# Patient Record
Sex: Female | Born: 1950 | Race: White | Hispanic: No | Marital: Married | Smoking: Former smoker
Health system: Southern US, Community
[De-identification: ages and names within clinical notes are randomized; demographics above are authoritative.]

## PROBLEM LIST (undated history)

## (undated) DIAGNOSIS — I10 Essential (primary) hypertension: Secondary | ICD-10-CM

## (undated) DIAGNOSIS — R0600 Dyspnea, unspecified: Secondary | ICD-10-CM

## (undated) DIAGNOSIS — Q211 Atrial septal defect, unspecified: Secondary | ICD-10-CM

## (undated) DIAGNOSIS — M419 Scoliosis, unspecified: Secondary | ICD-10-CM

## (undated) DIAGNOSIS — J449 Chronic obstructive pulmonary disease, unspecified: Secondary | ICD-10-CM

## (undated) DIAGNOSIS — E785 Hyperlipidemia, unspecified: Secondary | ICD-10-CM

## (undated) HISTORY — DX: Scoliosis, unspecified: M41.9

## (undated) HISTORY — DX: Essential (primary) hypertension: I10

## (undated) HISTORY — PX: TONSILLECTOMY: SUR1361

## (undated) HISTORY — DX: Chronic obstructive pulmonary disease, unspecified: J44.9

## (undated) HISTORY — DX: Atrial septal defect, unspecified: Q21.10

## (undated) HISTORY — DX: Dyspnea, unspecified: R06.00

## (undated) HISTORY — DX: Hyperlipidemia, unspecified: E78.5

## (undated) HISTORY — PX: CARDIAC SURGERY: SHX584

## (undated) HISTORY — DX: Atrial septal defect: Q21.1

---

## 1999-07-18 ENCOUNTER — Other Ambulatory Visit: Admission: RE | Admit: 1999-07-18 | Discharge: 1999-07-18 | Payer: Self-pay | Admitting: *Deleted

## 2001-03-24 ENCOUNTER — Other Ambulatory Visit: Admission: RE | Admit: 2001-03-24 | Discharge: 2001-03-24 | Payer: Self-pay | Admitting: Obstetrics and Gynecology

## 2001-12-07 ENCOUNTER — Ambulatory Visit (HOSPITAL_COMMUNITY): Admission: RE | Admit: 2001-12-07 | Discharge: 2001-12-07 | Payer: Self-pay | Admitting: Family Medicine

## 2001-12-07 ENCOUNTER — Encounter: Payer: Self-pay | Admitting: Family Medicine

## 2002-12-09 ENCOUNTER — Encounter: Payer: Self-pay | Admitting: Family Medicine

## 2002-12-09 ENCOUNTER — Ambulatory Visit (HOSPITAL_COMMUNITY): Admission: RE | Admit: 2002-12-09 | Discharge: 2002-12-09 | Payer: Self-pay | Admitting: Family Medicine

## 2003-09-08 ENCOUNTER — Encounter: Payer: Self-pay | Admitting: Family Medicine

## 2003-09-08 ENCOUNTER — Ambulatory Visit (HOSPITAL_COMMUNITY): Admission: RE | Admit: 2003-09-08 | Discharge: 2003-09-08 | Payer: Self-pay | Admitting: Family Medicine

## 2003-09-12 ENCOUNTER — Emergency Department (HOSPITAL_COMMUNITY): Admission: EM | Admit: 2003-09-12 | Discharge: 2003-09-12 | Payer: Self-pay | Admitting: Emergency Medicine

## 2003-11-13 ENCOUNTER — Emergency Department (HOSPITAL_COMMUNITY): Admission: EM | Admit: 2003-11-13 | Discharge: 2003-11-13 | Payer: Self-pay | Admitting: Emergency Medicine

## 2003-11-19 ENCOUNTER — Inpatient Hospital Stay (HOSPITAL_COMMUNITY): Admission: EM | Admit: 2003-11-19 | Discharge: 2003-11-23 | Payer: Self-pay | Admitting: *Deleted

## 2004-08-02 ENCOUNTER — Ambulatory Visit (HOSPITAL_COMMUNITY): Admission: RE | Admit: 2004-08-02 | Discharge: 2004-08-02 | Payer: Self-pay | Admitting: Family Medicine

## 2004-08-03 ENCOUNTER — Emergency Department (HOSPITAL_COMMUNITY): Admission: EM | Admit: 2004-08-03 | Discharge: 2004-08-03 | Payer: Self-pay | Admitting: *Deleted

## 2004-09-03 ENCOUNTER — Emergency Department (HOSPITAL_COMMUNITY): Admission: EM | Admit: 2004-09-03 | Discharge: 2004-09-03 | Payer: Self-pay | Admitting: Emergency Medicine

## 2005-03-07 ENCOUNTER — Ambulatory Visit: Payer: Self-pay | Admitting: Cardiology

## 2005-05-15 ENCOUNTER — Emergency Department (HOSPITAL_COMMUNITY): Admission: EM | Admit: 2005-05-15 | Discharge: 2005-05-15 | Payer: Self-pay | Admitting: Emergency Medicine

## 2005-05-19 ENCOUNTER — Emergency Department (HOSPITAL_COMMUNITY): Admission: EM | Admit: 2005-05-19 | Discharge: 2005-05-19 | Payer: Self-pay | Admitting: Family Medicine

## 2005-05-30 ENCOUNTER — Ambulatory Visit (HOSPITAL_COMMUNITY): Admission: RE | Admit: 2005-05-30 | Discharge: 2005-05-30 | Payer: Self-pay | Admitting: Family Medicine

## 2005-07-08 ENCOUNTER — Ambulatory Visit (HOSPITAL_COMMUNITY): Admission: RE | Admit: 2005-07-08 | Discharge: 2005-07-08 | Payer: Self-pay | Admitting: Family Medicine

## 2005-07-11 ENCOUNTER — Ambulatory Visit (HOSPITAL_COMMUNITY): Admission: RE | Admit: 2005-07-11 | Discharge: 2005-07-11 | Payer: Self-pay | Admitting: Family Medicine

## 2005-07-31 ENCOUNTER — Ambulatory Visit (HOSPITAL_COMMUNITY): Admission: RE | Admit: 2005-07-31 | Discharge: 2005-07-31 | Payer: Self-pay | Admitting: Family Medicine

## 2006-03-21 ENCOUNTER — Emergency Department (HOSPITAL_COMMUNITY): Admission: EM | Admit: 2006-03-21 | Discharge: 2006-03-21 | Payer: Self-pay | Admitting: Emergency Medicine

## 2006-03-25 ENCOUNTER — Emergency Department (HOSPITAL_COMMUNITY): Admission: EM | Admit: 2006-03-25 | Discharge: 2006-03-25 | Payer: Self-pay | Admitting: *Deleted

## 2006-03-27 ENCOUNTER — Inpatient Hospital Stay (HOSPITAL_COMMUNITY): Admission: AD | Admit: 2006-03-27 | Discharge: 2006-04-01 | Payer: Self-pay | Admitting: Family Medicine

## 2006-03-27 ENCOUNTER — Ambulatory Visit (HOSPITAL_COMMUNITY): Admission: RE | Admit: 2006-03-27 | Discharge: 2006-03-27 | Payer: Self-pay | Admitting: Family Medicine

## 2006-03-28 ENCOUNTER — Ambulatory Visit: Payer: Self-pay | Admitting: *Deleted

## 2006-04-09 ENCOUNTER — Ambulatory Visit (HOSPITAL_COMMUNITY): Admission: RE | Admit: 2006-04-09 | Discharge: 2006-04-09 | Payer: Self-pay | Admitting: Family Medicine

## 2006-07-14 ENCOUNTER — Ambulatory Visit (HOSPITAL_COMMUNITY): Admission: RE | Admit: 2006-07-14 | Discharge: 2006-07-14 | Payer: Self-pay | Admitting: Family Medicine

## 2006-12-15 ENCOUNTER — Ambulatory Visit (HOSPITAL_COMMUNITY): Admission: RE | Admit: 2006-12-15 | Discharge: 2006-12-15 | Payer: Self-pay | Admitting: Family Medicine

## 2007-01-20 ENCOUNTER — Ambulatory Visit (HOSPITAL_COMMUNITY): Admission: RE | Admit: 2007-01-20 | Discharge: 2007-01-20 | Payer: Self-pay | Admitting: Family Medicine

## 2007-02-24 ENCOUNTER — Ambulatory Visit: Payer: Self-pay | Admitting: Cardiology

## 2007-07-17 ENCOUNTER — Ambulatory Visit (HOSPITAL_COMMUNITY): Admission: RE | Admit: 2007-07-17 | Discharge: 2007-07-17 | Payer: Self-pay | Admitting: Family Medicine

## 2007-10-23 ENCOUNTER — Ambulatory Visit (HOSPITAL_COMMUNITY): Admission: RE | Admit: 2007-10-23 | Discharge: 2007-10-23 | Payer: Self-pay | Admitting: Family Medicine

## 2007-12-25 ENCOUNTER — Other Ambulatory Visit: Admission: RE | Admit: 2007-12-25 | Discharge: 2007-12-25 | Payer: Self-pay | Admitting: Obstetrics and Gynecology

## 2008-02-04 ENCOUNTER — Ambulatory Visit: Payer: Self-pay | Admitting: Cardiology

## 2008-07-19 ENCOUNTER — Ambulatory Visit (HOSPITAL_COMMUNITY): Admission: RE | Admit: 2008-07-19 | Discharge: 2008-07-19 | Payer: Self-pay | Admitting: Family Medicine

## 2009-01-19 ENCOUNTER — Other Ambulatory Visit: Admission: RE | Admit: 2009-01-19 | Discharge: 2009-01-19 | Payer: Self-pay | Admitting: Obstetrics & Gynecology

## 2009-01-31 ENCOUNTER — Ambulatory Visit (HOSPITAL_COMMUNITY): Admission: RE | Admit: 2009-01-31 | Discharge: 2009-01-31 | Payer: Self-pay | Admitting: Family Medicine

## 2009-04-01 DIAGNOSIS — Q2111 Secundum atrial septal defect: Secondary | ICD-10-CM | POA: Insufficient documentation

## 2009-04-01 DIAGNOSIS — M4120 Other idiopathic scoliosis, site unspecified: Secondary | ICD-10-CM | POA: Insufficient documentation

## 2009-04-01 DIAGNOSIS — J449 Chronic obstructive pulmonary disease, unspecified: Secondary | ICD-10-CM

## 2009-04-01 DIAGNOSIS — Q211 Atrial septal defect: Secondary | ICD-10-CM

## 2009-04-01 DIAGNOSIS — I2789 Other specified pulmonary heart diseases: Secondary | ICD-10-CM

## 2009-04-01 DIAGNOSIS — R0609 Other forms of dyspnea: Secondary | ICD-10-CM | POA: Insufficient documentation

## 2009-04-01 DIAGNOSIS — J4489 Other specified chronic obstructive pulmonary disease: Secondary | ICD-10-CM | POA: Insufficient documentation

## 2009-04-06 ENCOUNTER — Ambulatory Visit: Payer: Self-pay | Admitting: Cardiology

## 2009-07-21 ENCOUNTER — Ambulatory Visit (HOSPITAL_COMMUNITY): Admission: RE | Admit: 2009-07-21 | Discharge: 2009-07-21 | Payer: Self-pay | Admitting: Family Medicine

## 2009-10-31 ENCOUNTER — Emergency Department (HOSPITAL_COMMUNITY): Admission: EM | Admit: 2009-10-31 | Discharge: 2009-10-31 | Payer: Self-pay | Admitting: Emergency Medicine

## 2009-11-08 ENCOUNTER — Observation Stay (HOSPITAL_COMMUNITY): Admission: EM | Admit: 2009-11-08 | Discharge: 2009-11-10 | Payer: Self-pay | Admitting: Internal Medicine

## 2009-11-08 ENCOUNTER — Ambulatory Visit: Payer: Self-pay | Admitting: Internal Medicine

## 2009-11-08 ENCOUNTER — Encounter: Payer: Self-pay | Admitting: Internal Medicine

## 2009-11-08 ENCOUNTER — Encounter: Payer: Self-pay | Admitting: Emergency Medicine

## 2009-11-10 ENCOUNTER — Encounter: Payer: Self-pay | Admitting: Cardiology

## 2009-11-10 ENCOUNTER — Ambulatory Visit: Payer: Self-pay | Admitting: Thoracic Surgery (Cardiothoracic Vascular Surgery)

## 2009-11-10 ENCOUNTER — Encounter: Payer: Self-pay | Admitting: Thoracic Surgery (Cardiothoracic Vascular Surgery)

## 2009-11-13 ENCOUNTER — Ambulatory Visit: Payer: Self-pay | Admitting: Cardiology

## 2009-11-13 LAB — CONVERTED CEMR LAB: POC INR: 1.1

## 2009-11-14 ENCOUNTER — Inpatient Hospital Stay (HOSPITAL_BASED_OUTPATIENT_CLINIC_OR_DEPARTMENT_OTHER): Admission: RE | Admit: 2009-11-14 | Discharge: 2009-11-14 | Payer: Self-pay | Admitting: Cardiology

## 2009-11-14 ENCOUNTER — Ambulatory Visit: Payer: Self-pay | Admitting: Cardiology

## 2009-11-22 ENCOUNTER — Telehealth: Payer: Self-pay | Admitting: Cardiology

## 2009-11-22 ENCOUNTER — Ambulatory Visit: Payer: Self-pay | Admitting: Internal Medicine

## 2009-11-28 ENCOUNTER — Ambulatory Visit: Payer: Self-pay | Admitting: Cardiology

## 2009-12-04 ENCOUNTER — Encounter
Admission: RE | Admit: 2009-12-04 | Discharge: 2009-12-04 | Payer: Self-pay | Admitting: Thoracic Surgery (Cardiothoracic Vascular Surgery)

## 2009-12-04 ENCOUNTER — Encounter: Payer: Self-pay | Admitting: Cardiology

## 2009-12-04 ENCOUNTER — Ambulatory Visit: Payer: Self-pay | Admitting: Thoracic Surgery (Cardiothoracic Vascular Surgery)

## 2009-12-08 ENCOUNTER — Emergency Department (HOSPITAL_COMMUNITY): Admission: EM | Admit: 2009-12-08 | Discharge: 2009-12-08 | Payer: Self-pay | Admitting: Emergency Medicine

## 2009-12-12 ENCOUNTER — Ambulatory Visit: Payer: Self-pay | Admitting: Thoracic Surgery (Cardiothoracic Vascular Surgery)

## 2009-12-12 ENCOUNTER — Inpatient Hospital Stay (HOSPITAL_COMMUNITY)
Admission: RE | Admit: 2009-12-12 | Discharge: 2009-12-16 | Payer: Self-pay | Admitting: Thoracic Surgery (Cardiothoracic Vascular Surgery)

## 2009-12-22 ENCOUNTER — Ambulatory Visit: Payer: Self-pay | Admitting: Thoracic Surgery (Cardiothoracic Vascular Surgery)

## 2009-12-22 ENCOUNTER — Encounter
Admission: RE | Admit: 2009-12-22 | Discharge: 2009-12-22 | Payer: Self-pay | Admitting: Thoracic Surgery (Cardiothoracic Vascular Surgery)

## 2009-12-22 ENCOUNTER — Encounter: Payer: Self-pay | Admitting: Cardiology

## 2009-12-29 ENCOUNTER — Telehealth: Payer: Self-pay | Admitting: Cardiology

## 2009-12-29 ENCOUNTER — Ambulatory Visit: Payer: Self-pay | Admitting: Internal Medicine

## 2009-12-29 DIAGNOSIS — R000 Tachycardia, unspecified: Secondary | ICD-10-CM

## 2010-01-01 ENCOUNTER — Telehealth: Payer: Self-pay | Admitting: Cardiology

## 2010-01-04 ENCOUNTER — Ambulatory Visit: Payer: Self-pay | Admitting: Cardiology

## 2010-01-05 ENCOUNTER — Telehealth: Payer: Self-pay | Admitting: Cardiology

## 2010-01-10 ENCOUNTER — Encounter (HOSPITAL_COMMUNITY): Admission: RE | Admit: 2010-01-10 | Discharge: 2010-02-09 | Payer: Self-pay | Admitting: Cardiology

## 2010-01-17 ENCOUNTER — Telehealth: Payer: Self-pay | Admitting: Cardiology

## 2010-01-29 ENCOUNTER — Ambulatory Visit: Payer: Self-pay | Admitting: Thoracic Surgery (Cardiothoracic Vascular Surgery)

## 2010-01-29 ENCOUNTER — Telehealth (INDEPENDENT_AMBULATORY_CARE_PROVIDER_SITE_OTHER): Payer: Self-pay | Admitting: *Deleted

## 2010-01-29 ENCOUNTER — Encounter
Admission: RE | Admit: 2010-01-29 | Discharge: 2010-01-29 | Payer: Self-pay | Admitting: Thoracic Surgery (Cardiothoracic Vascular Surgery)

## 2010-01-29 ENCOUNTER — Encounter: Payer: Self-pay | Admitting: Cardiology

## 2010-02-12 ENCOUNTER — Encounter (HOSPITAL_COMMUNITY): Admission: RE | Admit: 2010-02-12 | Discharge: 2010-03-14 | Payer: Self-pay | Admitting: Cardiology

## 2010-03-01 ENCOUNTER — Telehealth: Payer: Self-pay | Admitting: Cardiology

## 2010-03-15 ENCOUNTER — Encounter (HOSPITAL_COMMUNITY): Admission: RE | Admit: 2010-03-15 | Discharge: 2010-04-14 | Payer: Self-pay | Admitting: Cardiology

## 2010-03-16 ENCOUNTER — Encounter: Payer: Self-pay | Admitting: Cardiology

## 2010-03-28 ENCOUNTER — Encounter: Payer: Self-pay | Admitting: Cardiology

## 2010-04-02 ENCOUNTER — Ambulatory Visit: Payer: Self-pay | Admitting: Cardiology

## 2010-04-16 ENCOUNTER — Encounter (HOSPITAL_COMMUNITY): Admission: RE | Admit: 2010-04-16 | Discharge: 2010-05-16 | Payer: Self-pay | Admitting: Cardiology

## 2010-04-17 IMAGING — CR DG CHEST 2V
2 series · 2 of 2 positions shown · non-contrast
Comparison: CT angio chest 12/04/2009 and 11/08/2009.  Two-view
chest x-ray 01/31/2009.

CLINICAL DATA: Atrial septal defect.  Preoperative respiratory
evaluation.

CHEST - 2 VIEW 12/08/2009:

[view not recorded (1 of 2)]
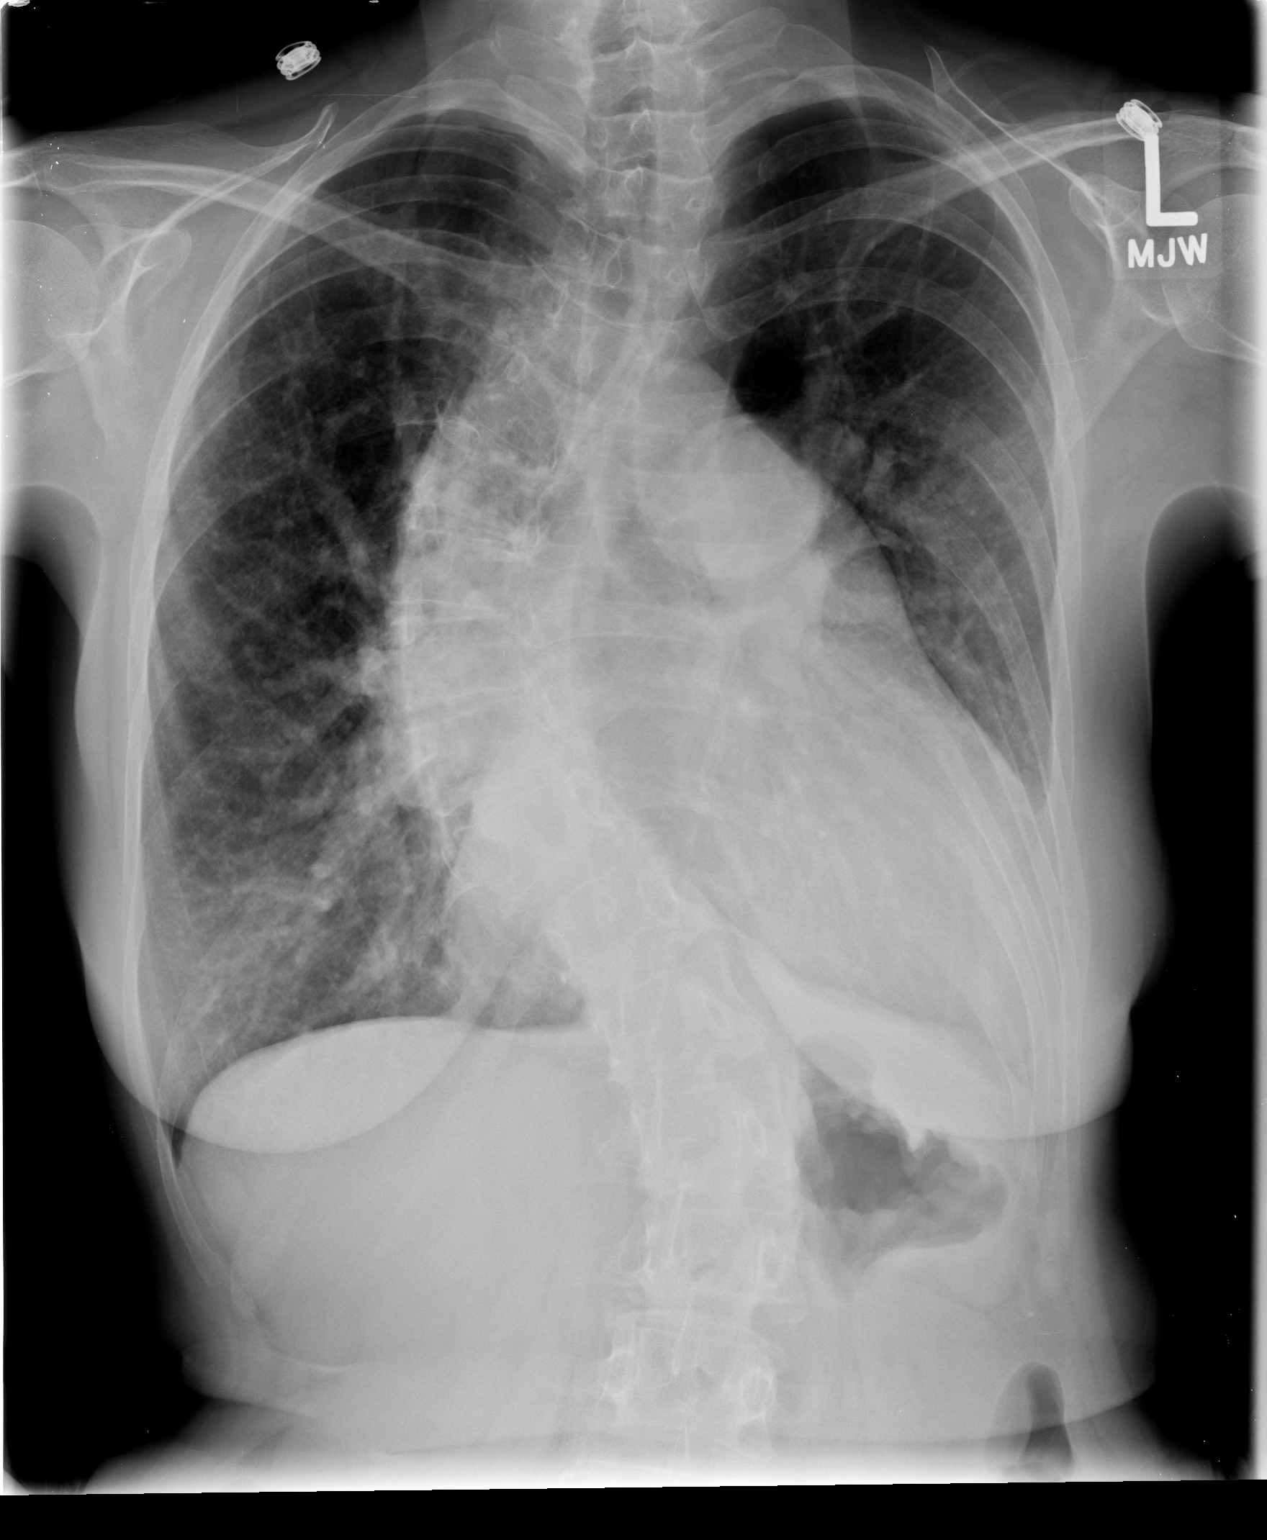

[view not recorded (2 of 2)]
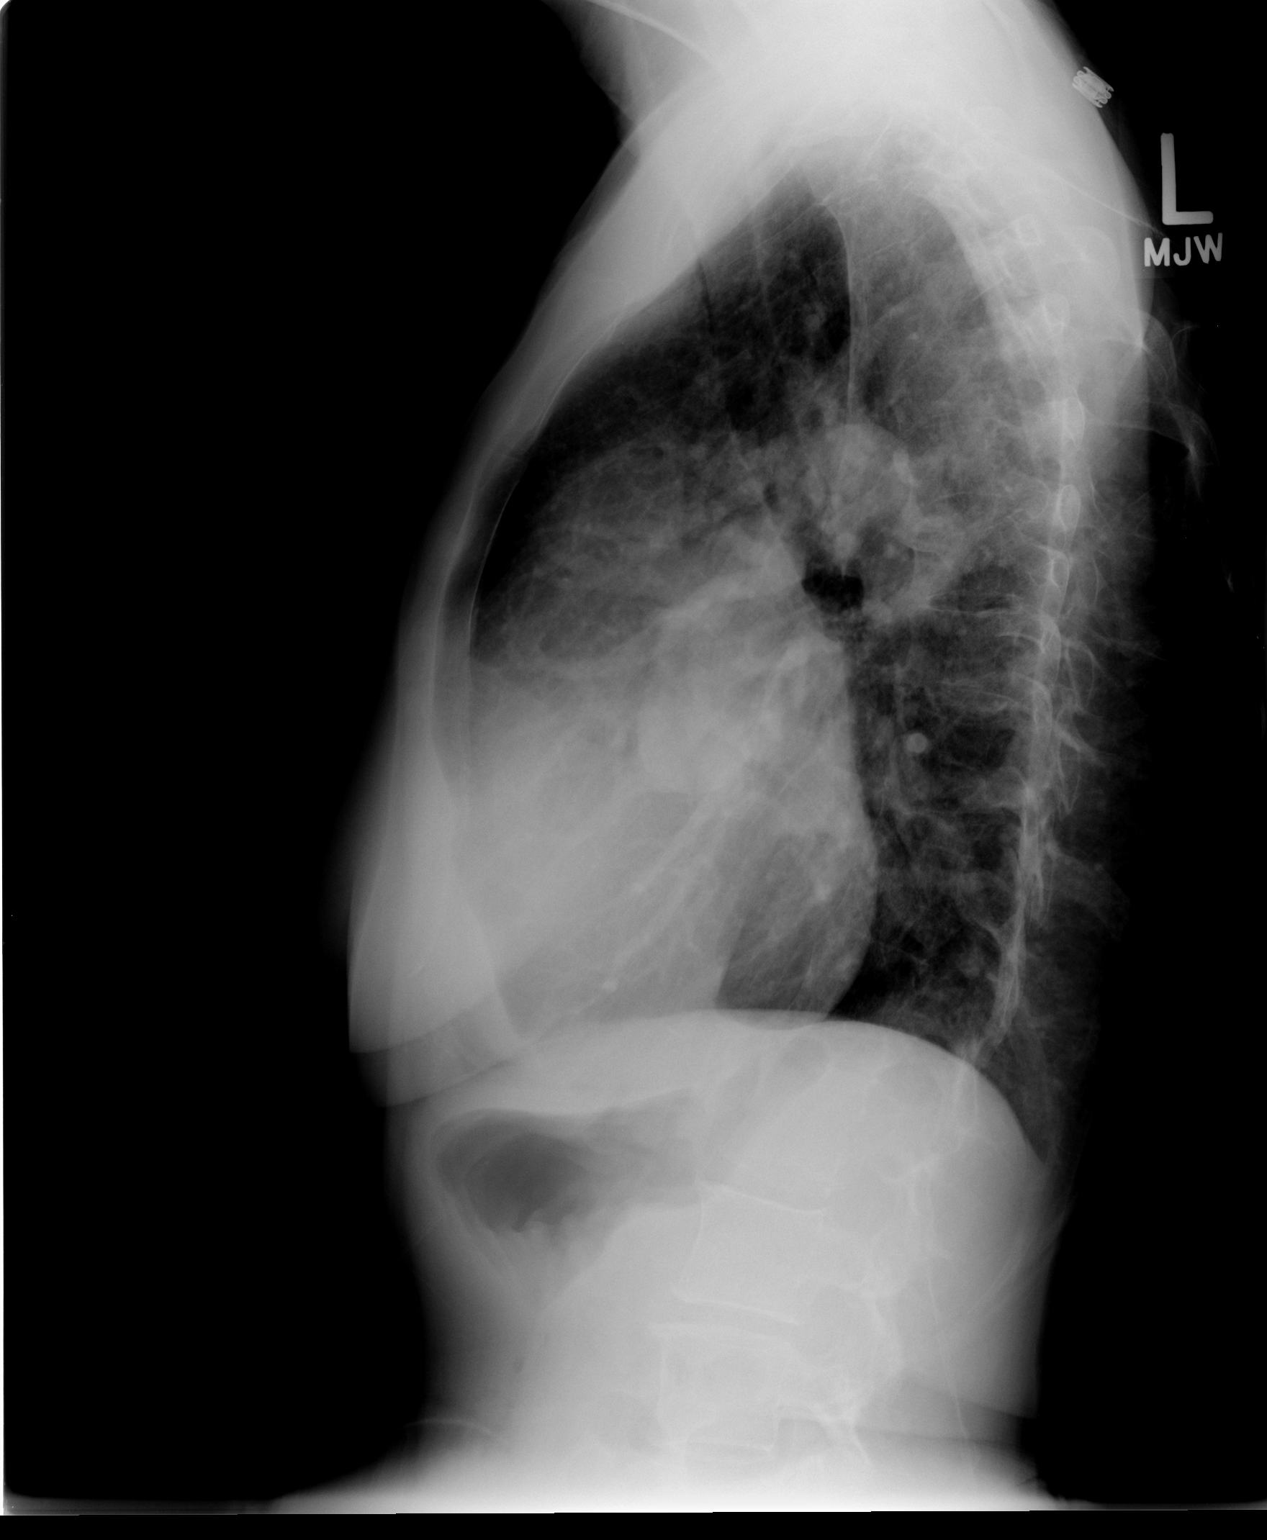

[2 of 2 positions shown; findings below may reference images not displayed]

FINDINGS: Marked cardiomegaly with marked right atrial enlargement
and shunt vascularity, unchanged. Lungs clear.  No pleural
effusions.  Severe thoracic scoliosis convex right, unchanged, with
compensatory thoracolumbar scoliosis convex left.
IMPRESSION: Stable marked cardiomegaly and shunt vascularity.  No acute
cardiopulmonary disease.

## 2010-04-24 IMAGING — CR DG CHEST 1V PORT
1 series · 1 of 1 positions shown · non-contrast
Comparison: 12/15/2009

CLINICAL DATA: Removal of chest tubes.

PORTABLE CHEST - 1 VIEW

[view not recorded]
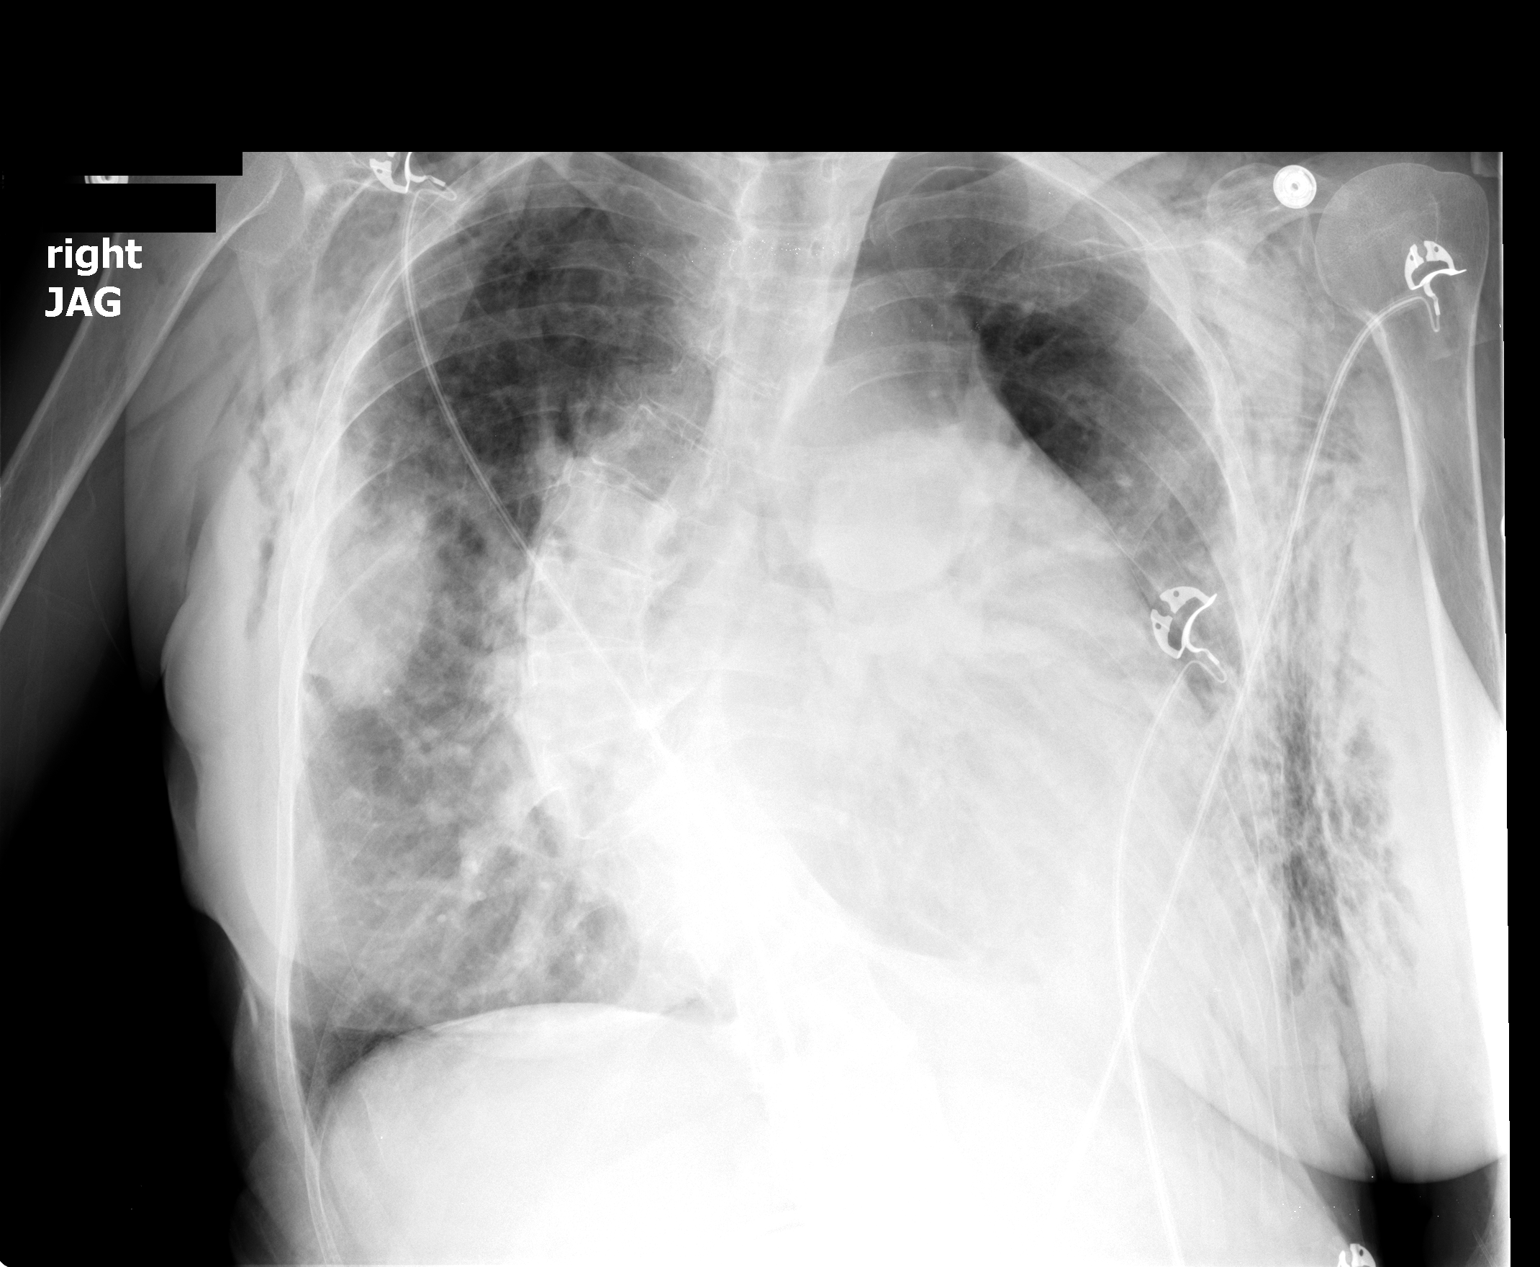

[1 of 1 positions shown; findings below may reference images not displayed]

FINDINGS: The right-sided chest tubes been removed.  There is a
small stable apical pneumothorax.  Stable fluid collection likely
in the right major fissure.  Stable subcutaneous emphysema. Stable
small apical left pneumothorax.
IMPRESSION: 1.  Removal of both right-sided chest tubes with a stable small
right apical pneumothorax.
2.  Stable small left apical pneumothorax.

## 2010-04-25 IMAGING — CR DG CHEST 2V
2 series · 2 of 2 positions shown · non-contrast
Comparison: 12/15/2009 and CT chest 12/04/2009.

CLINICAL DATA: ASD.  Follow-up chest tube removal.

CHEST - 2 VIEW

[w chest pa]
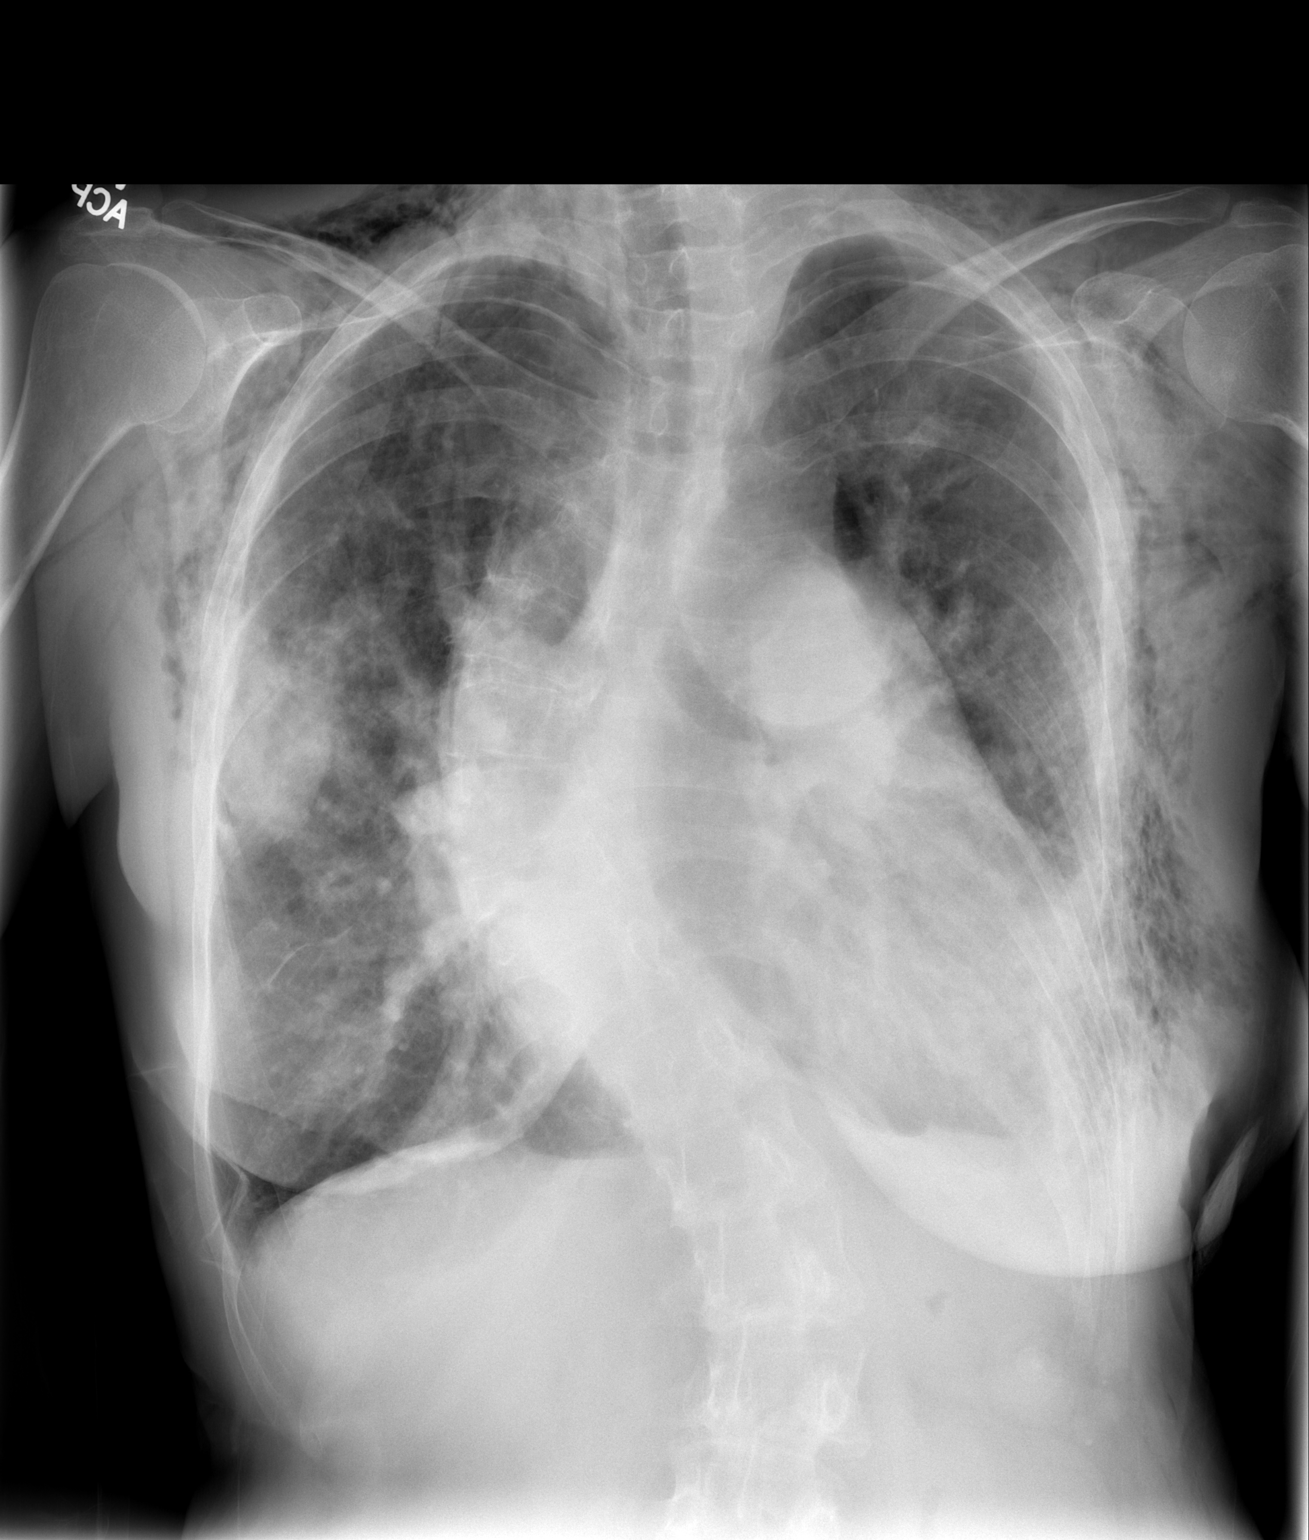

[w chest lat]
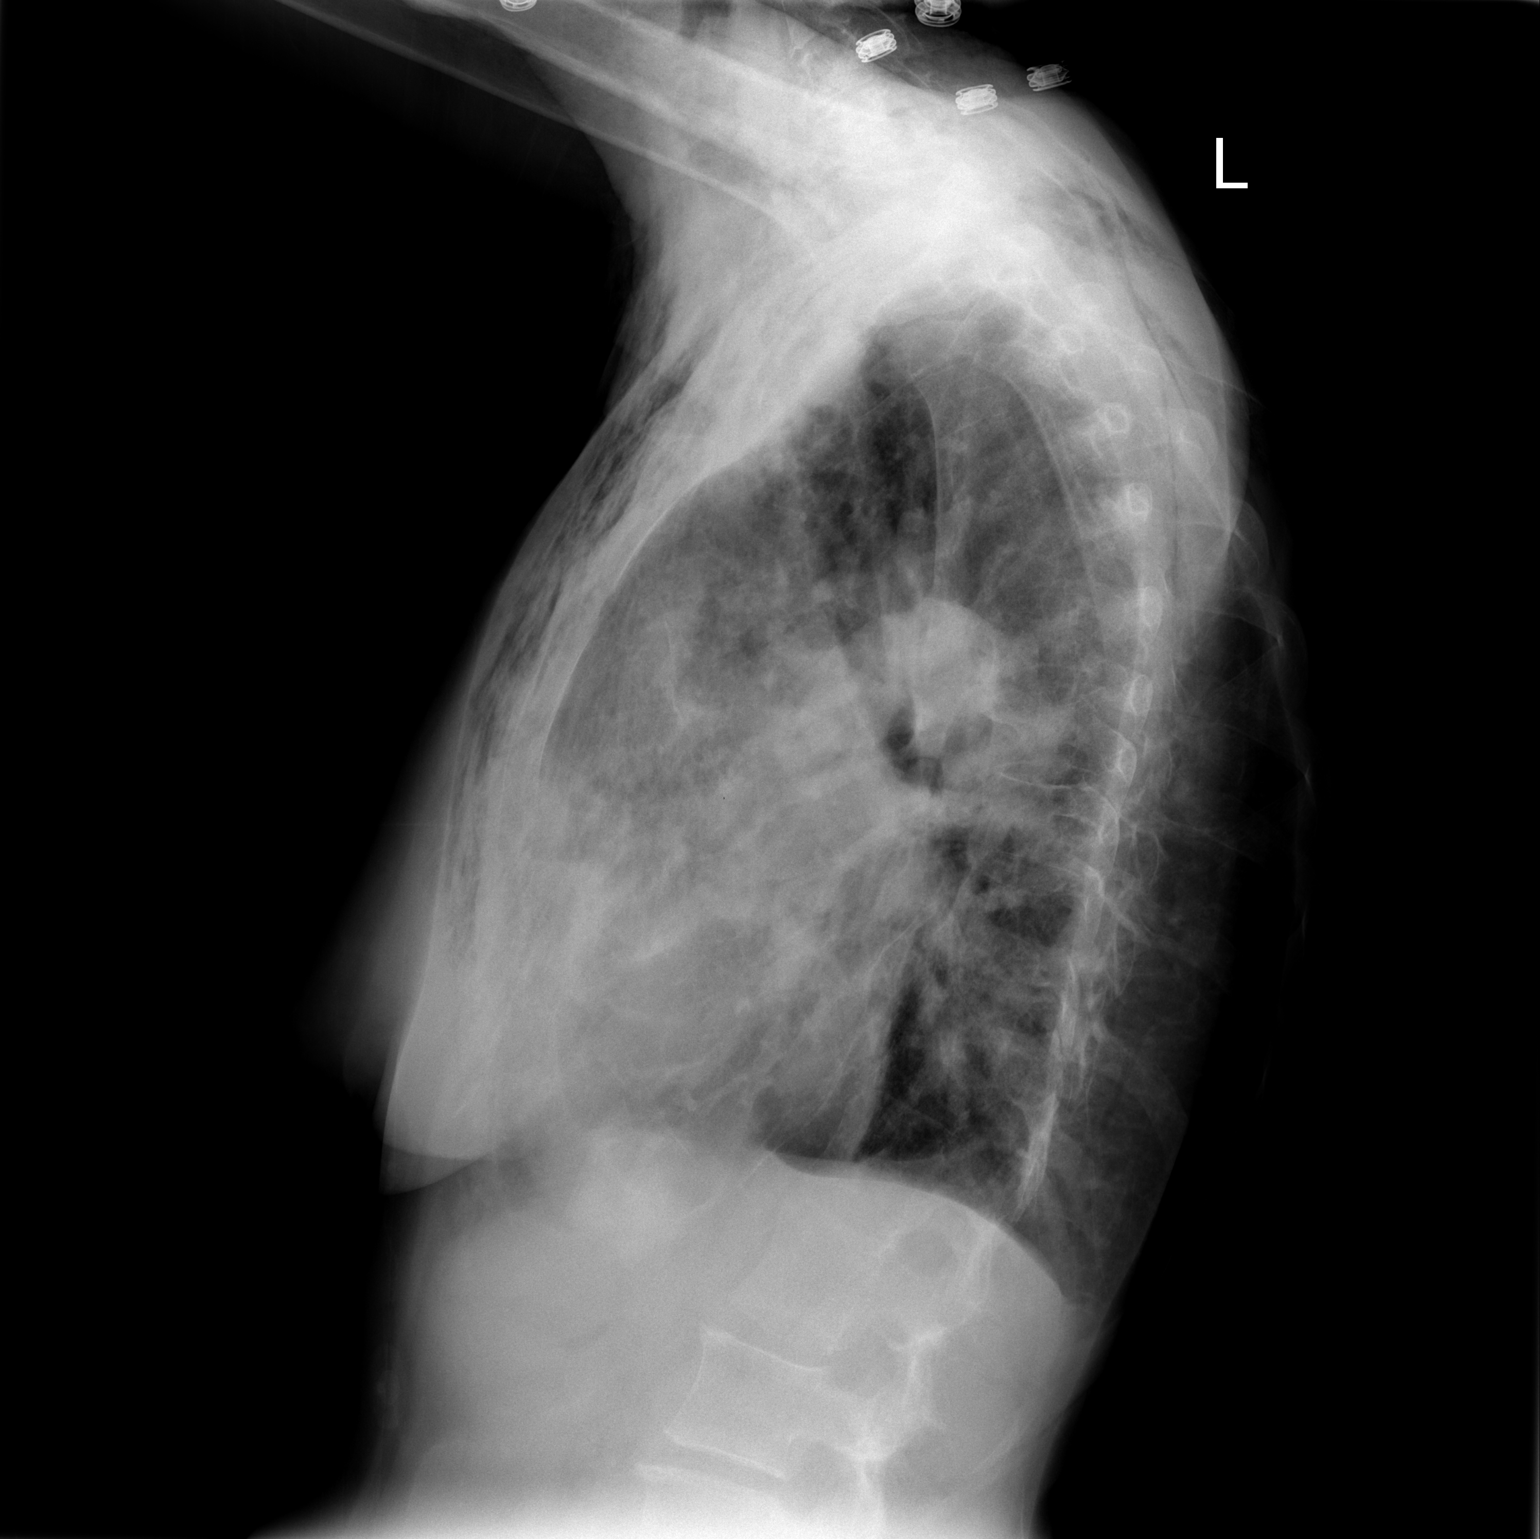

[2 of 2 positions shown; findings below may reference images not displayed]

FINDINGS: Moderate S-shaped scoliosis, convex to the right in the
mid thoracic spine convex left in the upper lumbar spine is noted.
There is cardiomegaly, with prominence of the central pulmonary
arteries and diffuse vascular congestion.  A peripherally based
opacity in the  right mid lung appears stable compared to recent
portable chest radiographs, and on the frontal view appears to be
anteriorly positioned, in the right upper lobe.  This may reflect
airspace disease secondary to infection, or possibly pulmonary
contusion.

There is probable atelectasis and a small amount of pleural fluid
in the left lung base.  Aeration of the left lung base is
improving.  There is diffuse subcutaneous emphysema along the chest
wall bilaterally, left greater than right and along both sides of
the neck.

Small bilateral pneumothoraces are visualized.  These are without
significant interval change compared to the prior day.
IMPRESSION: 1.  Small bilateral pneumothoraces, without significant interval
change.  There is associated extensive subcutaneous emphysema
bilaterally.
2.  Stable appearance of peripheral opacity in the right midlung,
favored to be in the right upper lobe.

## 2010-05-07 ENCOUNTER — Encounter
Admission: RE | Admit: 2010-05-07 | Discharge: 2010-05-07 | Payer: Self-pay | Admitting: Thoracic Surgery (Cardiothoracic Vascular Surgery)

## 2010-05-07 ENCOUNTER — Ambulatory Visit: Payer: Self-pay | Admitting: Thoracic Surgery (Cardiothoracic Vascular Surgery)

## 2010-05-07 ENCOUNTER — Encounter: Payer: Self-pay | Admitting: Cardiology

## 2010-05-09 ENCOUNTER — Encounter: Payer: Self-pay | Admitting: Cardiology

## 2010-05-17 ENCOUNTER — Encounter (HOSPITAL_COMMUNITY): Admission: RE | Admit: 2010-05-17 | Discharge: 2010-06-16 | Payer: Self-pay | Admitting: Cardiology

## 2010-05-23 ENCOUNTER — Encounter: Payer: Self-pay | Admitting: Cardiology

## 2010-07-10 ENCOUNTER — Other Ambulatory Visit: Admission: RE | Admit: 2010-07-10 | Discharge: 2010-07-10 | Payer: Self-pay | Admitting: Obstetrics & Gynecology

## 2010-09-02 ENCOUNTER — Emergency Department (HOSPITAL_COMMUNITY): Admission: EM | Admit: 2010-09-02 | Discharge: 2010-09-02 | Payer: Self-pay | Admitting: Emergency Medicine

## 2010-10-02 ENCOUNTER — Ambulatory Visit: Payer: Self-pay | Admitting: Cardiology

## 2010-10-02 ENCOUNTER — Ambulatory Visit: Payer: Self-pay

## 2010-10-02 ENCOUNTER — Ambulatory Visit (HOSPITAL_COMMUNITY): Admission: RE | Admit: 2010-10-02 | Discharge: 2010-10-02 | Payer: Self-pay | Admitting: Cardiology

## 2010-10-02 ENCOUNTER — Encounter: Payer: Self-pay | Admitting: Cardiology

## 2010-10-02 ENCOUNTER — Ambulatory Visit: Payer: Self-pay | Admitting: Cardiovascular Disease

## 2010-10-04 ENCOUNTER — Telehealth: Payer: Self-pay | Admitting: Cardiology

## 2010-10-05 ENCOUNTER — Telehealth: Payer: Self-pay | Admitting: Cardiology

## 2010-12-30 LAB — CONVERTED CEMR LAB
Calcium: 9.3 mg/dL (ref 8.4–10.5)
Creatinine, Ser: 0.57 mg/dL (ref 0.40–1.20)
Eosinophils Absolute: 0.2 10*3/uL (ref 0.0–0.7)
Eosinophils Relative: 4 % (ref 0–5)
Monocytes Absolute: 0.8 10*3/uL (ref 0.1–1.0)
Monocytes Relative: 13 % — ABNORMAL HIGH (ref 3–12)
Neutro Abs: 4.3 10*3/uL (ref 1.7–7.7)
RBC: 3.51 M/uL — ABNORMAL LOW (ref 3.87–5.11)
RDW: 13.4 % (ref 11.5–15.5)

## 2011-01-03 NOTE — Progress Notes (Signed)
Summary: pt wants amoxicillin for bronchitis  Phone Note Call from Patient Call back at Home Phone [phone]   Caller: Patient Reason for Call: Talk to Nurse, Talk to Doctor Summary of Call: pt has bronchitus and wants MD to call in a Rx for amoxicillin Initial call taken by: Omer Jack,  January 17, 2010 8:53 AM  Follow-up for Phone Call        Spoke with pt. Pt. states has bronchitis and would like for Dr. Daleen Squibb to call a prescription. I let pt. know she needs to see her PCP so he can prescrive an antibiotic. Pt. verbalized understanding. Follow-up by: Ollen Gross, RN, BSN,  January 17, 2010 9:12 AM

## 2011-01-03 NOTE — Progress Notes (Signed)
Summary: pt has elevated HR, night sweats & CP  Phone Note Call from Patient Call back at Home Phone [phone]   Caller: Spouse Reason for Call: Talk to Nurse, Talk to Doctor Summary of Call: pt heart rate is running around 117 and what can she do pt has been having night sweats and chest pain last time she had that was night befor last wednesday the 26th Initial call taken by: [name] [name],  December 29, 2009 11:50 AM  Follow-up for Phone Call        recently D/C'd from hospital 12-16-09 ASD repair---pt noticed heart rate 100-177for the last week--pt denies palpitations-- B/P today--123/77---some soreness in her chest at surgical site--pt saw Dr Barry Dienes last week and was told to continue Lasix because of edema  until she saw Dr Daleen Squibb 01-09-10--pt states her  temperature recently was 98.6-she denies having a fever--will review with Dr Gala Romney and follow-up with pateint [name] [name] pt to come to office for EKG today per Dr Gerome Sam agreed with this plan     New/Updated Medications: LOPRESSOR 50 MG TABS (METOPROLOL TARTRATE) one-half tablet twice a day FUROSEMIDE 40 MG TABS (FUROSEMIDE) one daily * POTASSIUM CHLORIDE not sure of the dose   Current Medications (verified): 1)  Synthroid 50 Mcg Tabs (Levothyroxine Sodium) .... Once Daily 2)  Alprazolam 0.5 Mg Tbdp (Alprazolam) .... Prn 3)  Xopenex Hfa 45 Mcg/act Aero (Levalbuterol Tartrate) .... Prn 4)  Aspirin 81 Mg Tbec (Aspirin) .... Take One Tablet By Mouth Daily 5)  Lopressor 50 Mg Tabs (Metoprolol Tartrate) .... One-Half Tablet Twice A Day 6)  Furosemide 40 Mg Tabs (Furosemide) .... One Daily 7)  Potassium Chloride .... Not Sure of The Dose  Allergies: 1)  ! * Avelox 2)  ! Sulfa 3)  ! Levaquin

## 2011-01-03 NOTE — Assessment & Plan Note (Signed)
Summary: rov./ gd   Visit Type:  rov Primary Provider:  Dr. Renard Matter  CC:  pt c/o right edema for 2 wks but is doing better now..she does c/o headaches and is wondering if Lopressor is causing the headaches.Marland Kitchenotherwise no other complaints today.  History of Present Illness: Mr Chirino returns today for followup after having her ASD repaired. This was accomplished on December 12, 2009. Please see operative report and discharge summary electronic medical record.  Since discharge, she is slowly improving as far as a soreness in her chest. She also soreness her right groin.  She was to come off her Lopressor now. She is convinced is causing headaches.  Her edema has resolved. She has one followup visit with Dr. Cornelius Moras.  Current Medications (verified): 1)  Synthroid 50 Mcg Tabs (Levothyroxine Sodium) .... Once Daily 2)  Alprazolam 0.5 Mg Tbdp (Alprazolam) .... Prn 3)  Aspirin 81 Mg Tbec (Aspirin) .... Take One Tablet By Mouth Daily 4)  Lopressor 50 Mg Tabs (Metoprolol Tartrate) .... One-Half Tablet Twice A Day  Allergies: 1)  ! * Avelox 2)  ! Sulfa 3)  ! 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   Risk Factors: Smoking Status: quit (04/01/2009)  Review of Systems       negative other than history of present illness  Vital Signs:  Patient profile:   60 year old female Height:      62 inches Weight:      99 pounds BMI:     18.17 Pulse rate:   94 / minute Pulse rhythm:   irregular BP sitting:   110 / 70  (left arm) Cuff size:   large  Vitals Entered By: Danielle Rankin, CMA (January 04, 2010 2:55 PM)  Physical Exam  General:  thin,  chronically ill, older than stated a Head:  normocephalic and atraumatic Neck:  Neck supple, no JVD. No masses, thyromegaly or abnormal cervical nodes. Chest Sparkles Mcneely:  bruising, healing scars Lungs:  clear to auscultation percussion. Heart:  normal S1 split S2 no gallop. Slight right ventricular lift   EKG  Procedure date:  01/04/2010  Findings:      normal sinus rhythm, left atrial enlargement, RSR prime in V1 and V2, ST segment and T-wave changes in inferior lateral  Impression & Recommendations:  Problem # 1:  ATRIAL SEPTAL DEFECT, SECUNDUM TYPE (ICD-745.5) Assessment Improved She is status post repair. She is slowing improving. It is encouraging that she has had no significant edema and that what edema she did have postop is resolving. I will see her back in 3 months. We'll perform an echocardiogram in 6 months. I suspect she will always need to be on beta blocker which I do not thinks causing her headaches.  Problem # 2:  TACHYCARDIA (ICD-785.0) Assessment: Improved  Orders: EKG w/ Interpretation (93000)  Patient Instructions: 1)  Your physician recommends that you schedule a follow-up appointment in: 3 MONTHS WITH DR Dove Gresham 2)  Your physician recommends that you continue on your current medications as directed. Please refer to the Current Medication list given to you today.

## 2011-01-03 NOTE — Medication Information (Signed)
Summary: Coumadin Clinic  Anticoagulant Therapy  Managed by: Inactive PCP: Dr. Renard Matter Supervising MD: Dietrich Pates MD,Virgil Slinger Indication 1: N/A Lab Used: [facility] [place] Site: [place]          Comments: Pt not on coumadin.   Was only checked 1 time pre-cardiac cath since it was not done at the lab with pre-op orders.  Allergies: 1)  ! * Avelox 2)  ! Sulfa 3)  ! Levaquin  Anticoagulation Management History:      Negative risk factors for bleeding include an age less than 20 years old.  The bleeding index is 'low risk'.  Negative CHADS2 values include Age > 18 years old.  Anticoagulation responsible provider: Kalena Mander MD,Riordan Walle.    Anticoagulation Management Assessment/Plan:      The target INR is N/A.  Anticoagulation instructions were given to [facility].  Results were reviewed/authorized by Inactive.         Prior Anticoagulation Instructions: Received call from [facility] that pt needs INR for cath tomorrow. INR 1.1 Result faxed to Aurea Graff in [place] lab

## 2011-01-03 NOTE — Assessment & Plan Note (Signed)
Summary: ekg--tachycardia  Nurse Visit   Vital Signs:  Patient profile:   60 year old female Height:      62 inches Weight:      96.25 pounds Pulse rate:   102 / minute Pulse rhythm:   regular BP supine:   108 / 68  (left arm)  Vitals Entered By: Katina Dung, RN, BSN (December 29, 2009 3:23 PM)  Impression & Recommendations:  Problem # 1:  ATRIAL SEPTAL DEFECT, SECUNDUM TYPE (ICD-745.5) Assessment New  pt called in and c/o rapid heart rate for the last week(see phone note 12-29-09)--pt states she feels OK but noticed her heart rate was in the 100-117 range for the past week--no other symptoms--EKG done and reviewed with Dr Janell Quiet Dr Terald Sleeper Lopressor to 25mg  two times a day--(pt has currently been taking Lopressor 12.5mg  two times a day--this was not correct on the med list)--CBC and BMP today--follow-up with Dr Daleen Squibb next week--pt agreed with this plan Katina Dung, RN, BSN  December 29, 2009 3:44 PM     Orders: T-Basic Metabolic Panel [id number]) T-CBC w/Diff [id number]) EKG w/ Interpretation (93000)   Patient Instructions: 1)  Your physician has recommended you make the following change in your medication:  2)  Increase Lopressor to 25mg  twice a day 3)  Your physician recommends that you return have lab work today--BMP/CBC -745.5 785.0  4)  Your physician recommends that you schedule a follow-up appointment in: 1 week with Dr Juanito Doom      Current Medications (verified): 1)  Synthroid 50 Mcg Tabs (Levothyroxine Sodium) .... Once Daily 2)  Alprazolam 0.5 Mg Tbdp (Alprazolam) .... Prn 3)  Xopenex Hfa 45 Mcg/act Aero (Levalbuterol Tartrate) .... Prn 4)  Aspirin 81 Mg Tbec (Aspirin) .... Take One Tablet By Mouth Daily 5)  Furosemide 40 Mg Tabs (Furosemide) .... One Daily 6)  Potassium Chloride .... Not Sure of The Dose 7)  Lopressor 50 Mg Tabs (Metoprolol Tartrate) .... One-Half Tablet Twice A Day  Allergies: 1)  ! * Avelox 2)  ! Sulfa 3)  !  Levaquin  Orders Added: 1)  T-Basic Metabolic Panel [[id number]] 2)  T-CBC w/Diff [[id number]] 3)  EKG w/ Interpretation [93000]

## 2011-01-03 NOTE — Progress Notes (Signed)
Summary: labs results  Phone Note Call from Patient Call back at Home Phone [phone]   Caller: Patient Reason for Call: Lab or Test Results Initial call taken by: Judie Grieve,  January 01, 2010 2:47 PM  Follow-up for Phone Call        Spoke with patient. Pt. would like to have the lab  results. Lab work was  done  last friday 12/29/09. I let pt know MD has not review labs at this time. MD's nurse will call with results as soon as it is done. Okay with pt. Ollen Gross, RN, BSN  January 01, 2010 2:57 PM

## 2011-01-03 NOTE — Progress Notes (Signed)
Summary: pt HR to low  Phone Note Call from Patient Call back at [phone]   Caller: Patient Reason for Call: Talk to Nurse, Talk to Doctor Summary of Call: pt was doing rehab and HR was only 90 during excercise and it should be more and the techs thinks she is over medicated and she wanted to talk to you about it Initial call taken by: Omer Jack,  March 01, 2010 9:46 AM  Follow-up for Phone Call        SPOKE WITH PT CURRENTLY  TAKING LOPRESSOR 25 MG  two times a day . HAS HAD ONE EPISODE OF DIZZINESS AND ALSO C/O FATIGUE RESTING HR IS 71 PER PT. WILL REVIEW WITH DR Vincente Asbridge AND CONTACT PT WITH TX PLAN. VERBALIZE UNDERSTANDING. Follow-up by: Scherrie Bateman, LPN,  March 01, 2010 2:15 PM  Additional Follow-up for Phone Call Additional follow up Details #1::        decrease to 12.5 mg bid Additional Follow-up by: Gaylord Shih, MD, [facility],  March 01, 2010 4:30 PM    Additional Follow-up for Phone Call Additional follow up Details #2::    pt aware to decrease lopressor to 12. 5 mg two times a day  per dr Phallon Haydu Follow-up by: Scherrie Bateman, LPN,  March 01, 2010 4:32 PM

## 2011-01-03 NOTE — Miscellaneous (Signed)
Summary: [facility] [place] 05-21-10  [facility] [place] 05-21-10   Imported By: Faythe Ghee 05/23/2010 10:12:52  _____________________________________________________________________  External Attachment:    Type:   Image     Comment:   External Document

## 2011-01-03 NOTE — Letter (Signed)
Summary: [facility]   [facility]   Imported By: Roderic Ovens 12/29/2009 16:04:52  _____________________________________________________________________  External Attachment:    Type:   Image     Comment:   External Document

## 2011-01-03 NOTE — Letter (Signed)
Summary: [facility]  [facility]   Imported By: Roderic Ovens 12/15/2009 13:59:19  _____________________________________________________________________  External Attachment:    Type:   Image     Comment:   External Document

## 2011-01-03 NOTE — Miscellaneous (Signed)
Summary: [facility]  [facility]   Imported By: Faythe Ghee 05/09/2010 13:51:58  _____________________________________________________________________  External Attachment:    Type:   Image     Comment:   External Document

## 2011-01-03 NOTE — Progress Notes (Signed)
Summary: ECHO RESULTS  Phone Note Call from Patient Call back at Home Phone [phone]   Caller: Patient Reason for Call: Talk to Nurse, Talk to Doctor, Lab or Test Results Summary of Call: ECHO RESULTS Initial call taken by: Roe Coombs,  October 05, 2010 4:48 PM  Follow-up for Phone Call        Echo results and Md's recommendations given to pt. She verbalized understanding. Follow-up by: Ollen Gross, RN, BSN,  October 05, 2010 5:25 PM

## 2011-01-03 NOTE — Letter (Signed)
Summary: [facility]   [facility]   Imported By: Roderic Ovens 02/08/2010 15:15:12  _____________________________________________________________________  External Attachment:    Type:   Image     Comment:   External Document

## 2011-01-03 NOTE — Letter (Signed)
Summary: [facility]  [facility]   Imported By: Roderic Ovens 06/05/2010 09:19:38  _____________________________________________________________________  External Attachment:    Type:   Image     Comment:   External Document

## 2011-01-03 NOTE — Progress Notes (Signed)
Summary: echo results  Phone Note Call from Patient   Caller: Patient [phone] Reason for Call: Talk to Nurse, Lab or Test Results Summary of Call: pt calling for echo results Initial call taken by: Glynda Jaeger,  October 04, 2010 3:23 PM  Follow-up for Phone Call        Pt awaiting echo results. Mylo Red RN     Appended Document: echo results see my comments on echo report.

## 2011-01-03 NOTE — Miscellaneous (Signed)
Summary: cardiac rehab [place]  cardiac rehab [place]   Imported By: Faythe Ghee 03/16/2010 15:11:51  _____________________________________________________________________  External Attachment:    Type:   Image     Comment:   External Document

## 2011-01-03 NOTE — Progress Notes (Signed)
Summary: questions about cardic rehab exerc[facility],  Phone Note Call from Patient Call back at Home Phone [phone] Call back at [phone]   Caller: Patient Summary of Call: Pt have quetions she needs to ask  regarding cardic rehab exercrise. Initial call taken by: Judie Grieve,  January 05, 2010 8:29 AM  Follow-up for Phone Call        PT ASKING IF OKAY TO START REHAB 4 WEEKS OUT FROM SX. SPOKE  WITH DR Stacie Templin  2 PLUS WEEKS OUT FROM PROCEDURE IS OKAY TO START REHAB. Follow-up by: Scherrie Bateman, LPN,  January 05, 2010 9:24 AM  Additional Follow-up for Phone Call Additional follow up Details #1::        PT AWARE.

## 2011-01-03 NOTE — Assessment & Plan Note (Signed)
Summary: 6 MO F/U ./CY   Visit Type:  6 mo f/u Primary Provider:  Dr. Renard Matter  CC:  pt states she feels alot better...offers no cardiac complaints today.  History of Present Illness: [name] [name] returns today for an echocardiogram and followup of her history of atrial septal defect repair in January of this year. Echo is pending.  She denies any significant dyspnea on exertion, syncope or presyncope, palpitations, or lower extremity edema.  Current Medications (verified): 1)  Synthroid 50 Mcg Tabs (Levothyroxine Sodium) .... Once Daily 2)  Alprazolam 0.5 Mg Tbdp (Alprazolam) .... Prn 3)  Aspirin 81 Mg Tbec (Aspirin) .... Take One Tablet By Mouth Daily 4)  Metoprolol Tartrate 25 Mg Tabs (Metoprolol Tartrate) .... 1/2 Tab Two Times A Day 5)  Amoxicillin 500 Mg Caps (Amoxicillin) .... 2000 Mg 1-2 Hours Before Cleaning  Allergies: 1)  ! * Avelox 2)  ! Sulfa 3)  ! 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   Risk Factors: Smoking Status: quit (04/01/2009)  Review of Systems       negative other than history of present illness  Vital Signs:  Patient profile:   60 year old female Height:      62 inches Weight:      108.8 pounds BMI:     19.97 Pulse rate:   72 / minute Pulse rhythm:   regular BP sitting:   108 / 62  (left arm) Cuff size:   large  Vitals Entered By: Danielle Rankin, CMA (October 02, 2010 3:15 PM)  Physical Exam  General:  thin, no acute distress Head:  normocephalic and atraumatic Eyes:  glasses otherwise normal Neck:  Neck supple, no JVD. No masses, thyromegaly or abnormal cervical nodes. Lungs:  Clear bilaterally to  auscultation and percussion. Heart:  PMI nondisplaced, regular rate and rhythm, S2 splits, soft systolic murmur along left sternal border. No obvious right ventricular lift Msk:  Back normal, normal gait. Muscle strength and tone normal. Pulses:  pulses normal in all 4 extremities Extremities:  No clubbing or cyanosis. Neurologic:  Alert and oriented x 3. Skin:  Intact without lesions or rashes. Psych:  Normal affect.   Impression & Recommendations:  Problem # 1:  ATRIAL SEPTAL DEFECT, SECUNDUM TYPE (ICD-745.5) Assessment Improved  status post repair, exam stable, suspect echo will be unremarkable. Followup in a year. We'll call her with echo results.  Problem # 2:  HYPERTENSION, PULMONARY (ICD-416.8) hopefully, this will show improvement. Clinically she appears to be improved.  Problem # 3:  TACHYCARDIA (ICD-785.0) Assessment: Improved  Patient Instructions: 1)  Your physician recommends that you schedule a follow-up appointment in: 12 months--we will call you with results of echo

## 2011-01-03 NOTE — Progress Notes (Signed)
  Faxed 12 lead over to [facility] w/ Dr.Owens Office to fax [phone] [facility]  January 29, 2010 2:23 PM

## 2011-01-03 NOTE — Assessment & Plan Note (Signed)
Summary: PER CHECK OUT/SF   Visit Type:  3 mo f/u Primary Provider:  Dr. Renard Matter  CC:  no cardiac complaints today.  History of Present Illness: Ms Tercero returns today for further evaluation and management of her history of congenital heart disease, status post secundum atrial septal defect repair with a core matrix patch December 12, 2009.  She's been in cardiac rehabilitation and [facility] and clearly her strength and dyspnea have improved. She denies any peripheral edema. She's had no palpitations or syncope. Her resting heart rate is now down to 70s.  She is anxious to get her teeth cleaned. A vascular weight till July 11 her after and to take amoxicillin per [facility] guideline  She denies any fever chills or chest pain  Current Medications (verified): 1)  Synthroid 50 Mcg Tabs (Levothyroxine Sodium) .... Once Daily 2)  Alprazolam 0.5 Mg Tbdp (Alprazolam) .... Prn 3)  Aspirin 81 Mg Tbec (Aspirin) .... Take One Tablet By Mouth Daily 4)  Metoprolol Tartrate 25 Mg Tabs (Metoprolol Tartrate) .... 1/2 Tab Two Times A Day  Allergies: 1)  ! * Avelox 2)  ! Sulfa 3)  ! 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   Risk Factors: Smoking Status: quit (04/01/2009)  Review of Systems        negative other than history of present illness  Vital Signs:  Patient profile:   60 year old female Height:      62 inches Weight:      96 pounds BMI:     17.62 Pulse rate:   76 / minute Pulse rhythm:   regular BP sitting:   112 / 70  (left arm) Cuff size:   large  Vitals Entered By: Danielle Rankin, CMA (Apr 02, 2010 2:01 PM)  Physical  Exam  General:  she looks much healthier with better color since her surgery. No acute distress Head:  normocephalic and atraumatic Eyes:  PERRLA/EOM intact; conjunctiva and lids normal. Mouth:  Teeth, gums and palate normal. Oral mucosa normal. Neck:  Neck supple, no JVD. No masses, thyromegaly or abnormal cervical nodes. Chest Yocelin Vanlue:  no deformities or breast masses noted Lungs:  decreased breath sounds throughout Heart:  regular rate and rhythm, normal S1 and split S2, no obvious murmur, and no right-sided gallop or lift Abdomen:  Bowel sounds positive; abdomen soft and non-tender without masses, organomegaly, or hernias noted. No hepatosplenomegaly. Msk:  Back normal, normal gait. Muscle strength and tone normal. Pulses:  pulses normal in all 4 extremities Extremities:  No clubbing or cyanosis. Neurologic:  Alert and oriented x 3. Skin:  Intact without lesions or rashes. Psych:  Normal affect.   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 Take one tablet by mouth daily    Metoprolol Tartrate 25 Mg Tabs (Metoprolol tartrate) .Marland Kitchen... 1/2 tab two times a day  Problem # 3:  TACHYCARDIA (ICD-785.0) Assessment: Improved  Problem # 4:  COPD (ICD-496) Assessment: Unchanged  Patient Instructions: 1)  Your physician recommends that you schedule a follow-up  appointment in: 6 MONTHS WITH DR Ruthvik Barnaby 2)  Your physician recommends that you continue on your current medications as directed. Please refer to the Current Medication list given to you today. 3)  Your physician has requested that you have an echocardiogram.  Echocardiography is a painless test that uses sound waves to create images of your heart. It provides your doctor with information about the size and  shape of your heart and how well your heart's chambers and valves are working.  This procedure takes approximately one hour. There are no restrictions for this procedure. IN 6 MONTHS  Prescriptions: AMOXICILLIN 500 MG CAPS (AMOXICILLIN) 2000 MG 1-2 HOURS BEFORE CLEANING  #4 x 0   Entered by:   Scherrie Bateman, LPN   Authorized by:   Gaylord Shih, MD, [facility]   Signed by:   Scherrie Bateman, LPN on 25/95/6387   Method used:   Electronically to        [facility] (retail)       [address]       [place], [place]  [address]       Ph: [phone]       Fax: [phone]   RxID:   [id number]

## 2011-02-13 LAB — DIFFERENTIAL
Basophils Absolute: 0 K/uL (ref 0.0–0.1)
Basophils Relative: 0 % (ref 0–1)
Eosinophils Absolute: 0 K/uL (ref 0.0–0.7)
Eosinophils Relative: 1 % (ref 0–5)
Lymphocytes Relative: 9 % — ABNORMAL LOW (ref 12–46)
Lymphs Abs: 0.9 K/uL (ref 0.7–4.0)
Monocytes Absolute: 1 K/uL (ref 0.1–1.0)
Monocytes Relative: 10 % (ref 3–12)
Neutro Abs: 7.6 K/uL (ref 1.7–7.7)
Neutrophils Relative %: 80 % — ABNORMAL HIGH (ref 43–77)

## 2011-02-13 LAB — COMPREHENSIVE METABOLIC PANEL
ALT: 9 U/L (ref 0–35)
AST: 16 U/L (ref 0–37)
Albumin: 3.6 g/dL (ref 3.5–5.2)
Alkaline Phosphatase: 92 U/L (ref 39–117)
CO2: 24 mEq/L (ref 19–32)
Chloride: 103 mEq/L (ref 96–112)
Creatinine, Ser: 0.62 mg/dL (ref 0.4–1.2)
GFR calc Af Amer: >60 mL/min (ref >60)
GFR calc non Af Amer: >60 mL/min (ref >60)
Potassium: 3.9 mEq/L (ref 3.5–5.1)
Sodium: 136 mEq/L (ref 135–145)
Total Bilirubin: 1.3 mg/dL — ABNORMAL HIGH (ref 0.3–1.2)

## 2011-02-13 LAB — URINE MICROSCOPIC-ADD ON

## 2011-02-13 LAB — URINALYSIS, ROUTINE W REFLEX MICROSCOPIC
Glucose, UA: NEGATIVE mg/dL
Nitrite: NEGATIVE
Protein, ur: NEGATIVE mg/dL
Urobilinogen, UA: 0.2 mg/dL (ref 0.0–1.0)

## 2011-02-13 LAB — CBC
HCT: 44 % (ref 36.0–46.0)
Hemoglobin: 15 g/dL (ref 12.0–15.0)
MCH: 31.5 pg (ref 26.0–34.0)
Platelets: 165 10*3/uL (ref 150–400)
RBC: 4.76 MIL/uL (ref 3.87–5.11)
WBC: 9.5 10*3/uL (ref 4.0–10.5)

## 2011-02-13 LAB — LIPASE, BLOOD: Lipase: 24 U/L (ref 11–59)

## 2011-02-17 LAB — POCT I-STAT, CHEM 8
Creatinine, Ser: 0.6 mg/dL (ref 0.4–1.2)
Glucose, Bld: 145 mg/dL — ABNORMAL HIGH (ref 70–99)
HCT: 28 % — ABNORMAL LOW (ref 36.0–46.0)
HCT: 35 % — ABNORMAL LOW (ref 36.0–46.0)
Hemoglobin: 11.9 g/dL — ABNORMAL LOW (ref 12.0–15.0)
Hemoglobin: 9.5 g/dL — ABNORMAL LOW (ref 12.0–15.0)
Potassium: 4.4 mEq/L (ref 3.5–5.1)
Sodium: 141 mEq/L (ref 135–145)
TCO2: 27 mmol/L (ref 0–100)
TCO2: 28 mmol/L (ref 0–100)

## 2011-02-17 LAB — POCT I-STAT 3, ART BLOOD GAS (G3+)
Acid-base deficit: 1 mmol/L (ref 0.0–2.0)
Acid-base deficit: 1 mmol/L (ref 0.0–2.0)
Acid-base deficit: 2 mmol/L (ref 0.0–2.0)
Acid-base deficit: 3 mmol/L — ABNORMAL HIGH (ref 0.0–2.0)
Bicarbonate: 23.5 mEq/L (ref 20.0–24.0)
Bicarbonate: 23.5 mEq/L (ref 20.0–24.0)
Bicarbonate: 24.2 mEq/L — ABNORMAL HIGH (ref 20.0–24.0)
Bicarbonate: 28.8 mEq/L — ABNORMAL HIGH (ref 20.0–24.0)
O2 Saturation: 100 %
O2 Saturation: 99 %
Patient temperature: 36.8
Patient temperature: 97.1
Patient temperature: 97.4
TCO2: 25 mmol/L (ref 0–100)
TCO2: 30 mmol/L (ref 0–100)
pCO2 arterial: 33.1 mmHg — ABNORMAL LOW (ref 35.0–45.0)
pCO2 arterial: 51.4 mmHg — ABNORMAL HIGH (ref 35.0–45.0)
pCO2 arterial: 57.2 mmHg (ref 35.0–45.0)
pCO2 arterial: 58.5 mmHg (ref 35.0–45.0)
pCO2 arterial: 89.4 mmHg (ref 35.0–45.0)
pH, Arterial: 7.119 — CL (ref 7.350–7.400)
pH, Arterial: 7.247 — ABNORMAL LOW (ref 7.350–7.400)
pH, Arterial: 7.281 — ABNORMAL LOW (ref 7.350–7.400)
pH, Arterial: 7.306 — ABNORMAL LOW (ref 7.350–7.400)
pH, Arterial: 7.31 — ABNORMAL LOW (ref 7.350–7.400)
pH, Arterial: 7.388 (ref 7.350–7.400)
pO2, Arterial: 117 mmHg — ABNORMAL HIGH (ref 80.0–100.0)
pO2, Arterial: 295 mmHg — ABNORMAL HIGH (ref 80.0–100.0)

## 2011-02-17 LAB — HEMOGLOBIN AND HEMATOCRIT, BLOOD
HCT: 28.9 % — ABNORMAL LOW (ref 36.0–46.0)
Hemoglobin: 10 g/dL — ABNORMAL LOW (ref 12.0–15.0)

## 2011-02-17 LAB — TYPE AND SCREEN

## 2011-02-17 LAB — BASIC METABOLIC PANEL
BUN: 12 mg/dL (ref 6–23)
BUN: 8 mg/dL (ref 6–23)
CO2: 27 mEq/L (ref 19–32)
CO2: 31 mEq/L (ref 19–32)
Calcium: 8 mg/dL — ABNORMAL LOW (ref 8.4–10.5)
Chloride: 103 mEq/L (ref 96–112)
Chloride: 111 mEq/L (ref 96–112)
Chloride: 97 mEq/L (ref 96–112)
Creatinine, Ser: 0.55 mg/dL (ref 0.4–1.2)
Creatinine, Ser: 0.59 mg/dL (ref 0.4–1.2)
GFR calc Af Amer: >60 mL/min (ref >60)
GFR calc Af Amer: >60 mL/min (ref >60)
Glucose, Bld: 107 mg/dL — ABNORMAL HIGH (ref 70–99)
Glucose, Bld: 152 mg/dL — ABNORMAL HIGH (ref 70–99)
Potassium: 4.3 mEq/L (ref 3.5–5.1)
Sodium: 138 mEq/L (ref 135–145)

## 2011-02-17 LAB — GLUCOSE, CAPILLARY
Glucose-Capillary: 105 mg/dL — ABNORMAL HIGH (ref 70–99)
Glucose-Capillary: 114 mg/dL — ABNORMAL HIGH (ref 70–99)
Glucose-Capillary: 121 mg/dL — ABNORMAL HIGH (ref 70–99)
Glucose-Capillary: 131 mg/dL — ABNORMAL HIGH (ref 70–99)
Glucose-Capillary: 141 mg/dL — ABNORMAL HIGH (ref 70–99)
Glucose-Capillary: 179 mg/dL — ABNORMAL HIGH (ref 70–99)
Glucose-Capillary: 72 mg/dL (ref 70–99)
Glucose-Capillary: 75 mg/dL (ref 70–99)
Glucose-Capillary: 98 mg/dL (ref 70–99)

## 2011-02-17 LAB — POCT I-STAT 4, (NA,K, GLUC, HGB,HCT)
Glucose, Bld: 131 mg/dL — ABNORMAL HIGH (ref 70–99)
Glucose, Bld: 158 mg/dL — ABNORMAL HIGH (ref 70–99)
Glucose, Bld: 48 mg/dL — ABNORMAL LOW (ref 70–99)
Glucose, Bld: 90 mg/dL (ref 70–99)
HCT: 26 % — ABNORMAL LOW (ref 36.0–46.0)
HCT: 29 % — ABNORMAL LOW (ref 36.0–46.0)
HCT: 34 % — ABNORMAL LOW (ref 36.0–46.0)
HCT: 39 % (ref 36.0–46.0)
Hemoglobin: 11.6 g/dL — ABNORMAL LOW (ref 12.0–15.0)
Hemoglobin: 6.8 g/dL — CL (ref 12.0–15.0)
Hemoglobin: 8.8 g/dL — ABNORMAL LOW (ref 12.0–15.0)
Hemoglobin: 9.9 g/dL — ABNORMAL LOW (ref 12.0–15.0)
Potassium: 3.3 mEq/L — ABNORMAL LOW (ref 3.5–5.1)
Potassium: 3.5 mEq/L (ref 3.5–5.1)
Potassium: 3.9 mEq/L (ref 3.5–5.1)
Potassium: 5.6 mEq/L — ABNORMAL HIGH (ref 3.5–5.1)
Sodium: 138 mEq/L (ref 135–145)
Sodium: 139 mEq/L (ref 135–145)
Sodium: 142 mEq/L (ref 135–145)
Sodium: 142 mEq/L (ref 135–145)

## 2011-02-17 LAB — CBC
HCT: 27.5 % — ABNORMAL LOW (ref 36.0–46.0)
HCT: 37.3 % (ref 36.0–46.0)
HCT: 46.8 % — ABNORMAL HIGH (ref 36.0–46.0)
Hemoglobin: 9.4 g/dL — ABNORMAL LOW (ref 12.0–15.0)
Hemoglobin: 9.8 g/dL — ABNORMAL LOW (ref 12.0–15.0)
MCHC: 33.4 g/dL (ref 30.0–36.0)
MCHC: 34 g/dL (ref 30.0–36.0)
MCHC: 34.2 g/dL (ref 30.0–36.0)
MCHC: 34.2 g/dL (ref 30.0–36.0)
MCHC: 34.5 g/dL (ref 30.0–36.0)
MCV: 95 fL (ref 78.0–100.0)
MCV: 95.4 fL (ref 78.0–100.0)
MCV: 95.9 fL (ref 78.0–100.0)
MCV: 97.2 fL (ref 78.0–100.0)
MCV: 97.5 fL (ref 78.0–100.0)
Platelets: 186 10*3/uL (ref 150–400)
Platelets: 70 10*3/uL — ABNORMAL LOW (ref 150–400)
Platelets: 75 10*3/uL — ABNORMAL LOW (ref 150–400)
Platelets: 79 10*3/uL — ABNORMAL LOW (ref 150–400)
RBC: 2.71 MIL/uL — ABNORMAL LOW (ref 3.87–5.11)
RBC: 2.86 MIL/uL — ABNORMAL LOW (ref 3.87–5.11)
RBC: 3 MIL/uL — ABNORMAL LOW (ref 3.87–5.11)
RBC: 3.65 MIL/uL — ABNORMAL LOW (ref 3.87–5.11)
RBC: 3.93 MIL/uL (ref 3.87–5.11)
RDW: 15 % (ref 11.5–15.5)
RDW: 15.1 % (ref 11.5–15.5)
WBC: 5.8 10*3/uL (ref 4.0–10.5)
WBC: 6.6 10*3/uL (ref 4.0–10.5)
WBC: 7.5 10*3/uL (ref 4.0–10.5)
WBC: 8.4 10*3/uL (ref 4.0–10.5)

## 2011-02-17 LAB — BLOOD GAS, ARTERIAL
Acid-Base Excess: 1.7 mmol/L (ref 0.0–2.0)
Bicarbonate: 25.5 meq/L — ABNORMAL HIGH (ref 20.0–24.0)
Drawn by: 206361
FIO2: 0.21 %
O2 Saturation: 96.6 %
Patient temperature: 98.6
TCO2: 26.7 mmol/L (ref 0–100)
pCO2 arterial: 38.4 mmHg (ref 35.0–45.0)
pH, Arterial: 7.438 — ABNORMAL HIGH (ref 7.350–7.400)
pO2, Arterial: 77.1 mmHg — ABNORMAL LOW (ref 80.0–100.0)

## 2011-02-17 LAB — COMPREHENSIVE METABOLIC PANEL WITH GFR
ALT: 15 U/L (ref 0–35)
AST: 28 U/L (ref 0–37)
Albumin: 4.8 g/dL (ref 3.5–5.2)
Alkaline Phosphatase: 86 U/L (ref 39–117)
BUN: 8 mg/dL (ref 6–23)
CO2: 30 meq/L (ref 19–32)
Calcium: 9.7 mg/dL (ref 8.4–10.5)
Chloride: 102 meq/L (ref 96–112)
Creatinine, Ser: 0.77 mg/dL (ref 0.4–1.2)
GFR calc non Af Amer: >60 mL/min
Glucose, Bld: 75 mg/dL (ref 70–99)
Potassium: 4.2 meq/L (ref 3.5–5.1)
Sodium: 141 meq/L (ref 135–145)
Total Bilirubin: 1.4 mg/dL — ABNORMAL HIGH (ref 0.3–1.2)
Total Protein: 7.5 g/dL (ref 6.0–8.3)

## 2011-02-17 LAB — POCT I-STAT 3, VENOUS BLOOD GAS (G3P V)
Acid-base deficit: 1 mmol/L (ref 0.0–2.0)
O2 Saturation: 98 %
Patient temperature: 36.6
pCO2, Ven: 67.7 mmHg — ABNORMAL HIGH (ref 45.0–50.0)

## 2011-02-17 LAB — CREATININE, SERUM
Creatinine, Ser: 0.52 mg/dL (ref 0.4–1.2)
Creatinine, Ser: 0.65 mg/dL (ref 0.4–1.2)
GFR calc Af Amer: >60 mL/min (ref >60)
GFR calc Af Amer: >60 mL/min (ref >60)
GFR calc non Af Amer: >60 mL/min (ref >60)
GFR calc non Af Amer: >60 mL/min (ref >60)

## 2011-02-17 LAB — URINALYSIS, ROUTINE W REFLEX MICROSCOPIC
Glucose, UA: NEGATIVE mg/dL
Hgb urine dipstick: NEGATIVE
Ketones, ur: NEGATIVE mg/dL
Protein, ur: NEGATIVE mg/dL
Urobilinogen, UA: 0.2 mg/dL (ref 0.0–1.0)

## 2011-02-17 LAB — MAGNESIUM
Magnesium: 2.2 mg/dL (ref 1.5–2.5)
Magnesium: 2.8 mg/dL — ABNORMAL HIGH (ref 1.5–2.5)

## 2011-02-17 LAB — HEMOGLOBIN A1C
Hgb A1c MFr Bld: 5.5 % (ref 4.6–6.1)
Mean Plasma Glucose: 111 mg/dL

## 2011-02-17 LAB — ETHANOL: Alcohol, Ethyl (B): <5 mg/dL (ref 0–10)

## 2011-02-17 LAB — PROTIME-INR
INR: 1.12 (ref 0.00–1.49)
Prothrombin Time: 14.3 seconds (ref 11.6–15.2)

## 2011-02-17 LAB — APTT: aPTT: 30 s (ref 24–37)

## 2011-02-17 LAB — RAPID URINE DRUG SCREEN, HOSP PERFORMED
Benzodiazepines: POSITIVE — AB
Cocaine: NOT DETECTED

## 2011-02-17 LAB — ABO/RH: ABO/RH(D): A POS

## 2011-03-05 LAB — DIFFERENTIAL
Basophils Absolute: 0 10*3/uL (ref 0.0–0.1)
Basophils Relative: 0 % (ref 0–1)
Eosinophils Absolute: 0 10*3/uL (ref 0.0–0.7)
Eosinophils Relative: 0 % (ref 0–5)
Neutrophils Relative %: 87 % — ABNORMAL HIGH (ref 43–77)

## 2011-03-05 LAB — POCT I-STAT 3, VENOUS BLOOD GAS (G3P V)
Acid-Base Excess: 1 mmol/L (ref 0.0–2.0)
Acid-Base Excess: 1 mmol/L (ref 0.0–2.0)
Acid-base deficit: 5 mmol/L — ABNORMAL HIGH (ref 0.0–2.0)
Bicarbonate: 22.3 mEq/L (ref 20.0–24.0)
Bicarbonate: 25.6 mEq/L — ABNORMAL HIGH (ref 20.0–24.0)
O2 Saturation: 74 %
O2 Saturation: 75 %
O2 Saturation: 84 %
O2 Saturation: 86 %
O2 Saturation: 86 %
TCO2: 27 mmol/L (ref 0–100)
TCO2: 27 mmol/L (ref 0–100)
TCO2: 28 mmol/L (ref 0–100)
TCO2: 28 mmol/L (ref 0–100)
TCO2: 28 mmol/L (ref 0–100)
pCO2, Ven: 44.3 mmHg — ABNORMAL LOW (ref 45.0–50.0)
pCO2, Ven: 44.3 mmHg — ABNORMAL LOW (ref 45.0–50.0)
pCO2, Ven: 44.9 mmHg — ABNORMAL LOW (ref 45.0–50.0)
pCO2, Ven: 45.4 mmHg (ref 45.0–50.0)
pCO2, Ven: 46.4 mmHg (ref 45.0–50.0)
pCO2, Ven: 50.8 mmHg — ABNORMAL HIGH (ref 45.0–50.0)
pH, Ven: 7.363 — ABNORMAL HIGH (ref 7.250–7.300)
pH, Ven: 7.37 — ABNORMAL HIGH (ref 7.250–7.300)
pH, Ven: 7.372 — ABNORMAL HIGH (ref 7.250–7.300)
pO2, Ven: 28 mmHg — CL (ref 30.0–45.0)
pO2, Ven: 42 mmHg (ref 30.0–45.0)
pO2, Ven: 54 mmHg — ABNORMAL HIGH (ref 30.0–45.0)

## 2011-03-05 LAB — BASIC METABOLIC PANEL
BUN: 12 mg/dL (ref 6–23)
Chloride: 98 mEq/L (ref 96–112)
Creatinine, Ser: 0.82 mg/dL (ref 0.4–1.2)
Glucose, Bld: 157 mg/dL — ABNORMAL HIGH (ref 70–99)
Potassium: 4.3 mEq/L (ref 3.5–5.1)

## 2011-03-05 LAB — COMPREHENSIVE METABOLIC PANEL
AST: 22 U/L (ref 0–37)
Albumin: 2.9 g/dL — ABNORMAL LOW (ref 3.5–5.2)
Alkaline Phosphatase: 45 U/L (ref 39–117)
Chloride: 102 mEq/L (ref 96–112)
GFR calc Af Amer: >60 mL/min (ref >60)
Potassium: 4.3 mEq/L (ref 3.5–5.1)
Total Bilirubin: 0.5 mg/dL (ref 0.3–1.2)

## 2011-03-05 LAB — CARDIAC PANEL(CRET KIN+CKTOT+MB+TROPI)
CK, MB: 3.4 ng/mL (ref 0.3–4.0)
Relative Index: INVALID (ref 0.0–2.5)
Relative Index: INVALID (ref 0.0–2.5)
Total CK: 89 U/L (ref 7–177)
Troponin I: 0.04 ng/mL (ref 0.00–0.06)
Troponin I: 0.11 ng/mL — ABNORMAL HIGH (ref 0.00–0.06)

## 2011-03-05 LAB — POCT I-STAT 3, ART BLOOD GAS (G3+)
O2 Saturation: 93 %
TCO2: 28 mmol/L (ref 0–100)
pCO2 arterial: 43.7 mmHg (ref 35.0–45.0)
pH, Arterial: 7.397 (ref 7.350–7.400)
pO2, Arterial: 66 mmHg — ABNORMAL LOW (ref 80.0–100.0)

## 2011-03-05 LAB — POCT CARDIAC MARKERS
Myoglobin, poc: 99.9 ng/mL (ref 12–200)
Troponin i, poc: <0.05 ng/mL (ref 0.00–0.09)

## 2011-03-05 LAB — BRAIN NATRIURETIC PEPTIDE: Pro B Natriuretic peptide (BNP): 474 pg/mL — ABNORMAL HIGH (ref 0.0–100.0)

## 2011-03-05 LAB — BLOOD GAS, ARTERIAL
Bicarbonate: 26.6 mEq/L — ABNORMAL HIGH (ref 20.0–24.0)
Patient temperature: 98.6
TCO2: 27.9 mmol/L (ref 0–100)
pH, Arterial: 7.419 — ABNORMAL HIGH (ref 7.350–7.400)
pO2, Arterial: 88.6 mmHg (ref 80.0–100.0)

## 2011-03-05 LAB — CBC
HCT: 48.3 % — ABNORMAL HIGH (ref 36.0–46.0)
MCV: 96.7 fL (ref 78.0–100.0)
Platelets: 178 10*3/uL (ref 150–400)
RDW: 13.1 % (ref 11.5–15.5)

## 2011-03-05 LAB — LIPID PANEL
Triglycerides: 54 mg/dL (ref <150)
VLDL: 11 mg/dL (ref 0–40)

## 2011-03-05 LAB — MAGNESIUM: Magnesium: 1.9 mg/dL (ref 1.5–2.5)

## 2011-03-06 LAB — DIFFERENTIAL
Basophils Relative: 0 % (ref 0–1)
Lymphocytes Relative: 18 % (ref 12–46)
Lymphs Abs: 1.4 10*3/uL (ref 0.7–4.0)
Monocytes Absolute: 0.7 10*3/uL (ref 0.1–1.0)
Monocytes Relative: 10 % (ref 3–12)
Neutro Abs: 5.5 10*3/uL (ref 1.7–7.7)
Neutrophils Relative %: 70 % (ref 43–77)

## 2011-03-06 LAB — URINE CULTURE: Colony Count: 7000

## 2011-03-06 LAB — BASIC METABOLIC PANEL
Calcium: 9.5 mg/dL (ref 8.4–10.5)
Creatinine, Ser: 0.76 mg/dL (ref 0.4–1.2)
GFR calc Af Amer: >60 mL/min (ref >60)
GFR calc non Af Amer: >60 mL/min (ref >60)
Sodium: 139 mEq/L (ref 135–145)

## 2011-03-06 LAB — URINALYSIS, ROUTINE W REFLEX MICROSCOPIC
Glucose, UA: NEGATIVE mg/dL
Hgb urine dipstick: NEGATIVE
pH: 5.5 (ref 5.0–8.0)

## 2011-03-06 LAB — CBC
Hemoglobin: 17.6 g/dL — ABNORMAL HIGH (ref 12.0–15.0)
RBC: 5.29 MIL/uL — ABNORMAL HIGH (ref 3.87–5.11)
WBC: 7.9 10*3/uL (ref 4.0–10.5)

## 2011-03-06 LAB — URINE MICROSCOPIC-ADD ON

## 2011-04-16 NOTE — Assessment & Plan Note (Signed)
OFFICE VISIT   [name] [name], ROLL I  DOB:  1951/07/16                                        May 07, 2010  CHART #:  [id number]   HISTORY:  The patient returns to the office today for routine followup  status post right miniature thoracotomy for patch closure of large  atrial septal defect on December 12, 2009.  She was last seen here in the  office on January 29, 2010.  Her postoperative recovery has been  entirely uneventful, and she returns for routine followup today.  She  was seen recently in the office in follow up by Dr. Daleen Squibb, and she plans  to continue to see him in the future.  Overall, she has no complaints.  She has minimal residual soreness on the right side that really does not  affect her at all, except if she moves funny or suddenly.  Overall, she  is very pleased.  She has no shortness of breath, and she notes that her  breathing is much better than it was prior to surgery.  She has not had  any tachy palpitations.  She is not having any dizzy spells.  The  remainder of her review of systems is entirely unremarkable.   CURRENT MEDICATIONS:  Aspirin, Synthroid, metoprolol, Xanax as needed.   PHYSICAL EXAMINATION:  Notable for a well-appearing female with blood  pressure 117/73, pulse is 74 and regular.  Examination of the chest  reveals a well-healed mini thoracotomy incision.  Auscultation reveals  clear breath sounds that are symmetrical bilaterally.  Cardiovascular  exam is notable for regular rate and rhythm.  No murmurs, rubs, or  gallops are noted.  The abdomen is soft, nontender.  The right groin  incision has healed completely.  There is no lower extremity edema.  The  remainder of her physical exam is unremarkable.   DIAGNOSTIC TESTS:  Chest x-ray performed today at the [facility] is reviewed.  This demonstrates clear lung fields bilaterally  with no pleural effusions.  No other abnormalities are noted.   IMPRESSION:   The patient has done very well following right mini  thoracotomy for patch closure of atrial septal defect.   PLAN:  In the future, the patient will call and return to see [place] as  needed.  All of her questions have been addressed.   Salvatore Decent. Cornelius Moras, M.D.  Electronically Signed   CHO/MEDQ  D:  05/07/2010  T:  05/08/2010  Job:  [id number]   cc:   Thomas C. Wall, MD, [facility]  Angus G. Renard Matter, MD

## 2011-04-16 NOTE — Assessment & Plan Note (Signed)
OFFICE VISIT   LARAMIE, [name] [name]  DOB:  1951-04-17                                        December 22, 2009  CHART #:  [id number]   HISTORY OF PRESENT ILLNESS:  The patient returns for routine followup  status post right miniature thoracotomy for patch closure of atrial  septal defect on December 12, 2009.  Her postoperative recovery has been  uneventful and she was discharged from the hospital the end of last  week.  She returns to the office today for routine followup.  She still  has soreness in her right anterolateral chest wall appropriate for her  miniature thoracotomy.  She states that her breathing is about the same  as it was prior to surgery and perhaps slightly better.  She still has  exertional shortness of breath, but it is certainly no worse than it was  prior to surgery.  She has not had any fevers or chills.  She has had  some swelling in her right lower extremity.  Her weight has been  gradually decreasing since she went home and she has dropped another 3  pounds over the last week.  Her appetite is okay.  She has no other  complaints.  She has not had any tachy palpitations.  The remainder of  her review of systems is unremarkable.   MEDICATIONS:  Remain unchanged from the time of hospital discharge.   PHYSICAL EXAMINATION:  Notable for a thin female with blood pressure  105/71, pulse 93, and oxygen saturation is 99% on room air.  Examination  of the chest reveals a miniature thoracotomy incision that is healing  nicely.  There is some swelling laterally related to the incision, but  given her chest wall anatomy this seems entirely appropriate and there  is no sign of any fluctuance or fluid collection.  All of her chest tube  sutures are removed.  Auscultation of the chest reveals fairly clear  breath sounds, which are symmetrical bilaterally.  No wheezes or rhonchi  are noted.  Cardiovascular exam is notable for regular rate and rhythm.  Her  heart murmur has gone.  Extremities, warm and well perfused.  There  is mild right lower extremity edema extending up the right lower leg.  The right groin incision is healing nicely and there is no fluctuance in  the groin to suggest a lymphocele or fluid collection.  No other  abnormalities are noted.   DIAGNOSTIC TESTS:  Chest x-ray performed today at the [facility] is reviewed.  This demonstrates essentially clear lung fields  bilaterally with mild residual pleural-based opacity in the right side  related to her surgery.  There are no sign of any significant pleural  effusions.  No other abnormalities are noted.   IMPRESSION:  The patient is doing well, now only 10 days status post  right miniature thoracotomy for patch closure of atrial septal defect.  She does have some swelling in her right lower leg that is presumably  related to venous cannulation and not unusual this early after surgery.  She has appropriate soreness in her chest and overall she seems to be  doing very well.   PLAN:  [name] have encouraged the patient to continue to gradually increase  her physical activity as tolerated.  [name] have reminded her to avoid any  heavy lifting or strenuous use of her arms or shoulders for at least  another 6 weeks.  [name] think that it is possible that within 2 weeks she  can go back to driving an automobile if the soreness in her chest is  decreased to the point where she no longer needs any pain medication.  [name]  have suggested that she might try some over-the-counter pain relievers  such as ibuprofen or Naprosyn to assist with management of her soreness  that she does not like taking Ultram.  [name] have also given her a  prescription for Vicodin tablets to see if this works better for her.  She will remain on Lasix and potassium for the time being.  She has been  reminded to keep her legs propped up as much as possible when she is not  up and on her feet.  All of her questions  have been addressed.  We will  plan to see her back in 6 weeks.   Salvatore Decent. Cornelius Moras, M.D.  Electronically Signed   CHO/MEDQ  D:  12/22/2009  T:  12/22/2009  Job:  [id number]   cc:   Thomas C. Wall, MD, [facility]  Angus G. Renard Matter, MD  Marca Ancona, MD

## 2011-04-16 NOTE — Assessment & Plan Note (Signed)
OFFICE VISIT   OPIE, FANTON I  DOB:  [birth year]                                        January 29, 2010  CHART #:  [id number]   HISTORY OF PRESENT ILLNESS:  The patient returns for followup status  post right miniature thoracotomy for patch closure of large atrial  septal defect on December 12, 2009.  Her postoperative recovery has been  uncomplicated.  She was last seen here in the office on December 22, 2009, at which time she was progressing reasonably well.  Since then,  she has continued to do well and without any significant problems or  complaints.  She states that the pain in her right anterolateral chest  wall has now essentially completely resolved, although she still has an  area of numbness located medially along the fourth intercostal nerve  root distribution.  Over the last couple of weeks, she has had some mild  pain in her left lateral chest.  This pain is persistent and not related  to physical activity.  Pain does not seem to be positional and does not  seem to be affected by deep breath.  The pain is localized at the site  of where she had a previous chest tube placed early after surgery.  She  has not had any shortness of breath.  Her activity level is slowly  improving.  She has not had any dizzy spells or tachy palpitations,  although she is aware of the fact that her heart rate always seems to  run fast and it did so prior to surgery.  The remainder of her review of  systems is unremarkable.   CURRENT MEDICATIONS:  Synthroid, Xanax, aspirin, metoprolol, and Tylenol  as needed.  Her dose of metoprolol has apparently been increased to 50  mg twice daily.   PHYSICAL EXAMINATION:  Notable for well-appearing female with blood  pressure 121/77, pulse 116 and regular, and oxygen saturation 98% on  room air.  HEENT exam is unrevealing.  Auscultation of the chest  demonstrates clear breath sounds, which are symmetrical bilaterally.  No  wheezes or rhonchi are noted.  Cardiovascular exam is notable for  regular rate and rhythm with somewhat elevated pulse rate.  No murmurs,  rubs, or gallops are noted.  The abdomen is soft and nontender.  The  extremities are warm and well perfused.  The right groin incision has  healed nicely and there is no sign of any swelling or tenderness in this  region.  The extremities are warm and well perfused and there is no  lower extremity edema.  The remainder of physical exam is unremarkable.   DIAGNOSTIC TESTS:  Chest [facility] performed at the [facility] today is reviewed.  This demonstrates clear lung fields  bilaterally with exception of a new small left pleural effusion.  The  right lung field appears clear.  No other significant abnormalities are  noted.   IMPRESSION:  Overall, the patient appears to be doing well.  She appears  to be in sinus rhythm, although her pulse remains elevated.  It was  somewhat elevated prior to surgery.  Her dose of beta-blocker has been  gradually increased by Dr. Daleen Squibb and colleagues.   PLAN:  I have encouraged the patient to continue to gradually increase  her physical activity as  tolerated.  I have cautioned her that if her  heart rate remains elevated, it might be reasonable for her to move up  her appointment with Dr. Daleen Squibb sooner, so that a followup  electrocardiogram can be performed to make sure that she is not having  some type of atrial tachy arrhythmia.  However, she has a history of  sinus tachycardia in the past, and she is not having any palpitations.  On exam, her pulse is entirely regular.  She otherwise seems to be doing  well.  She does have a very small left pleural effusion and associated  left lower lobe atelectasis, and we will plan to follow this in the  future.   PLAN:  We will have the patient return for followup in 3 months.  All of  her questions have been addressed.   Salvatore Decent. Cornelius Moras, M.D.  Electronically  Signed   CHO/MEDQ  D:  01/29/2010  T:  01/30/2010  Job:  [id number]   cc:   Thomas C. Wall, MD, [facility]  Angus G. Renard Matter, MD

## 2011-04-16 NOTE — H&P (Signed)
HISTORY AND PHYSICAL EXAMINATION   December 04, 2009   Re:  [name] [name], [name] [name]           DOB:  October 31, 1951   PRESENTING CHIEF COMPLAINT:  Shortness of breath.   HISTORY OF PRESENT ILLNESS:  The patient is a 60 year old female from  [place] with known history of secundum-type atrial septal defect with  pulmonary hypertension and chronic obstructive pulmonary disease.  She  was first diagnosed with atrial septal defect nearly 30 years ago when  she presented with tachy arrhythmia.  She was lost to follow up for some  time, but more recently has been followed by Dr. Valera Castle at the  [facility].  In 2005, she was referred to [facility] for possible percutaneous  closure of her atrial septal defect.  This procedure was aborted due to  the fact that her atrial septal defect was too large to be closed using  commercially available percutaneous devices.  She was referred to  consider elective surgical intervention at that time, but the patient  refused.  In December 2010, the patient was hospitalized with worsening  symptoms of shortness of breath that acutely became exacerbated  following prescription of oral antibiotics for presumed upper  respiratory tract infection.  She was noted to be in congestive heart  failure, and echocardiogram was performed confirming the presence of  large secundum-type atrial septal defect with normal left ventricular  systolic function and evidence for significant pulmonary hypertension.  She was hospitalized at that time, and right heart catheterization was  performed demonstrating PA pressures 48/26 with pulmonary capillary  wedge pressure of 11 and shunt fraction calculated to be 2.6 to 1.  Left  and right heart catheterization was performed on November 14, 2009,  confirmed the presence of normal coronary artery anatomy with no  significant coronary artery disease.  Transesophageal  echocardiogram was  performed confirming the presence of a very large secundum-type atrial  septal defect with severe right ventricular chamber enlargement.  There  was mild mitral regurgitation.  Pulmonary vein anatomy appeared to be  normal.  Atrial septal defect was clearly too large for percutaneous  closure.  Cardiothoracic surgical consultation was requested, and a full  consultation note was dictated at that time on November 10, 2009.  The  patient insisted on waiting until after the holidays to go through with  her surgery.  She had recently been treated for an acute flare of  diverticular disease, and these symptoms have all resolved.  She now  returns to the office today to consider elective surgical intervention  for closure of her atrial septal defect.   REVIEW OF SYSTEMS:  GENERAL:  The patient reports normal appetite.  She  states that her appetite is not much normally, but it is back to her  baseline.  CARDIAC:  The patient reports that her breathing is back to normal.  She  does have exertional shortness of breath, but this is stable.  She  denies resting shortness of breath, PND, orthopnea, tachy palpitations,  or dizzy spells.  RESPIRATORY:  Notable for persistent dry and nonproductive cough that  she states that she has had for years.  GASTROINTESTINAL:  Normal.  The patient reports no difficulty  swallowing.  Previous problems with abdominal pain and left lower  quadrant pain have completely resolved.  Bowel function is normal.  There is no history of hematochezia, hematemesis, or melena.  MUSCULOSKELETAL:  Negative.  GENITOURINARY:  Negative.  HEENT:  Negative.   PAST MEDICAL HISTORY:  1. Secundum-type atrial septal defect.  2. Pulmonary hypertension.  3. Hypertension.  4. Chronic obstructive pulmonary disease.  5. Acute diverticulitis, resolved.  6. Kyphoscoliosis.  7. Anxiety.  8. Osteoporosis.  9. Hypothyroidism.   PAST SURGICAL HISTORY:  Tonsillectomy  and adenoidectomy.   FAMILY HISTORY:  Noncontributory.   SOCIAL HISTORY:  The patient is married and lives with her husband in  [place].  She is a housewife.  She lives a sedentary lifestyle.  She  quit smoking 3 years ago.  Prior to that, she smoked in excess of 1 pack  a day for more than 35 years.  The patient denies excessive alcohol  consumption, although she typically drinks 1 or 2 glasses of wine each  night.   CURRENT MEDICATIONS:  1. Synthroid 50 mcg daily.  2. Aspirin 81 mg daily.  3. Zofran 1 tablet twice daily as needed for nausea.  4. Xopenex inhaler as needed.  5. ProAir inhaler as needed.   DRUG ALLERGIES:  1. Avelox.  2. Levaquin.   PHYSICAL EXAMINATION:  The patient is a well-appearing female who  appears her stated age in no acute distress.  Blood pressure 139/94,  pulse 110, oxygen saturation 95% on room air.  HEENT exam is  unrevealing.  The neck is supple.  There is no cervical nor  supraclavicular lymphadenopathy.  Auscultation of the chest demonstrates  clear breath sounds which are symmetrical.  No wheezes or rhonchi are  noted.  Cardiovascular exam is notable for regular rate and rhythm.  There is a grade 3/6 systolic murmur heard all across the precordium.  No diastolic murmurs are noted.  The abdomen is soft, nondistended, and  nontender.  Bowel sounds are present.  There are no palpable masses.  The extremities are warm and well perfused.  There is no lower extremity  edema.  There is mild tenderness in the site of left groin from previous  catheterization, but there is no sign of any false aneurysm.  Femoral  pulses are palpable.  Rectal and GU exams are both deferred.  The skin  is clean, dry, healthy appearing throughout.  Neurologic examination is  grossly nonfocal and symmetrical throughout.   IMPRESSION:  Large secundum-type atrial septal defect with associated  pulmonary hypertension and stable symptoms of exertional shortness of   breath.   PLAN:  [name] have outlined the indications, risks, and potential benefits of  elective surgical intervention for closure of atrial septal defect with  the patient and her family.  Alternative treatment strategies have been  discussed.  We plan to proceed with right miniature anterior thoracotomy  for closure of atrial septal defect on Tuesday, December 12, 2009.  They  understand and accept all potential associated risks of surgery  including but not limited to risk of death, stroke, myocardial  infarction, congestive heart failure, respiratory failure, acute right  heart failure, renal failure, pneumonia, respiratory failure, prolonged  chest wall discomfort, bleeding requiring blood transfusion, heart block  with bradycardia, irregular heart rhythm, possible complications related  to femoral artery cannulation.  All of their questions have been  addressed.    Salvatore Decent. Cornelius Moras, M.D.  Electronically Signed   CHO/MEDQ  D:  12/04/2009  T:  12/05/2009  Job:  [id number]   cc:   Thomas C. Wall, MD, [facility]  Angus G. Renard Matter, MD  Marca Ancona, MD

## 2011-04-16 NOTE — Assessment & Plan Note (Signed)
[facility]                            CARDIOLOGY OFFICE NOTE   [name] [name], [name] [name]                        MRN:          [id number]  DATE:02/04/2008                            DOB:          1951-07-17    [name] [name] returns today for follow-up for her large secundum atrial  septal defect.   She has some dyspnea on exertion that she attributes to her COPD.  As  stated in my last office note, she really does not want to have  correction of her ASD.  Fortunately, she denies any syncope, presyncope,  TIA symptoms, lower extremity edema or abdominal swelling.   She quit smoking 2 years ago.   She is on no cardiac medications.   Her blood pressure today is 121/80, her pulse is 90 and regular.  Her  respiratory rate is 18.  Her weight is 107, up 4.  She is a very pleasant lady in no acute distress.  Her skin is warm and  dry.  HEENT:  Normocephalic, atraumatic.  PERRLA.  Extraocular movements  intact.  Sclerae are clear.  Facial symmetry is normal.  Carotid upstrokes are equal bilaterally without bruits, no JVD.  Thyroid  is not enlarged.  Trachea is midline.  LUNGS:  Clear.  Her chest wall is thin.  She has a significant right ventricular left.  S2 splits normally.  There is no obvious gallop.  ABDOMEN:  Soft, good bowel sounds.  No midline bruit.  No obvious  hepatomegaly.  No ascites.  EXTREMITIES:  No edema.  Pulses are intact.  NEUROLOGIC:  Intact.   Her electrocardiogram shows biatrial enlargement, rightward axis, RSR  prime, and a septal wall infarct pattern which is probably not true.  Her EKG is stable.  She is in sinus rhythm.   I spent 20 minutes or so talking to [name] [name] again today about the  prophylactic reasoning behind having her ASD repaired.  After a long  discussion, she clearly does not want to proceed with that at this point  in time.  She was told at [facility] several years ago that the  ASD was too large to do an  umbrella.   She is aware of progressive right-sided failure and the clinical  consequences associated with that.  She also is under the impression of  increased risk of stroke, not to mention a decrease in longevity.  She  still fairly adamant about not having surgery.   I told her to keep her mind open and we would be glad to revisit it if  she wished.  I will see her back in a year.     Thomas C. Daleen Squibb, MD, [facility]  Electronically Signed    TCW/MedQ  DD: 02/04/2008  DT: 02/05/2008  Job #: [id number]   cc:   Angus G. Renard Matter, MD

## 2011-04-19 NOTE — Group Therapy Note (Signed)
NAMECARMESHA, [name] [name]                 ACCOUNT NO.:  [id number]   MEDICAL RECORD NO.:  [id number]          PATIENT TYPE:  INP   LOCATION:  A220                          FACILITY:  [facility]   PHYSICIAN:  Angus G. Renard Matter, MD   DATE OF BIRTH:  1950-12-25   DATE OF PROCEDURE:  03/30/2006  DATE OF DISCHARGE:                                   PROGRESS NOTE   SUBJECTIVE:  This patient was admitted with right lower lobe pneumonia and  COPD.  She does have history of atrial septal defect, kyphoscoliosis and is  feeling some better.  She is still coughing intermittently.  She remains  afebrile.   OBJECTIVE:  VITAL SIGNS:  Blood pressure 90/53, respirations 24, pulse 93,  temperature 98.5.  The patient's CBC shows WBC 6700 with a hemoglobin of  13.3 and hematocrit 39.4.  Electrolytes showed sodium 136, potassium 3.4,  chloride 102, CO2 27.  BNP 126.  LUNGS:  Coarse breath sounds.  Rhonchi in lower lungs.  HEART:  Regular rhythm.  The patient has grade 3/6 systolic murmur at left  upper sternal border.  ABDOMEN:  No palpable organs or masses.   ASSESSMENT:  The patient was admitted with bronchopneumonia currently  receiving intravenous Zosyn, chronic obstructive pulmonary disease, atrial  septal defect which is a problem the patient has had and discussed this  several times with cardiology.  The patient apparently does not desire to  have surgery.   PLAN:  Continue her current regimen.      Angus G. Renard Matter, MD  Electronically Signed     AGM/MEDQ  D:  03/30/2006  T:  03/31/2006  Job:  [id number]

## 2011-04-19 NOTE — H&P (Signed)
[name] [name], [name] [name] NO.:  [id number]   MEDICAL RECORD NO.:  [id number]                   PATIENT TYPE:  INP   LOCATION:  A330                                 FACILITY:  [facility]   PHYSICIAN:  Mila Homer. Sudie Bailey, M.D.           DATE OF BIRTH:  1951/03/15   DATE OF ADMISSION:  11/19/2003  DATE OF DISCHARGE:                                HISTORY & PHYSICAL   HISTORY OF PRESENT ILLNESS:  This 60 year old came to the emergency room  tonight, Saturday night, short of breath and wheezing. She had used  albuterol two puffs q.4 h. a day and still was short of breath.   She was seen in the ER about six days ago and placed on Zithromax for  bronchitis. At that time she was having some fever and chills. That has  gotten better during the week, but she still needed albuterol.   She started smoking at 62 and now continues to smoke 34 years later  averaging a half a pack daily. She gives a history of hole in the heart,  followed by Dr. Sawyer Bing, cardiologist, for this.  The cough  apparently was dry and she has been coughing up thick sputum. She denies  sore throat, earaches, or swollen glands. She has had no abdominal, kidney,  or urinary systems as far as she knows. She had an appendectomy years ago.   PHYSICAL EXAMINATION:  GENERAL:  This is a pleasant woman who is in no acute  distress.  VITAL SIGNS:  Temperature 97 degrees, blood pressure 129/95, heart rate 99,  respiratory rate 22. Room air pulse oximetry is 100%.  HEENT:  Her pharynx is normal. There in no maxillary or frontal sinus pain.  NECK:  There were negative anterior nodes.  LUNGS:  Clear. They are moving air well. No intercostal retractions. No use  of accessory muscles for respirations.  HEART:  Regular rate and rhythm with rate of about 70 with at least 2/6  systolic ejection murmur.  ABDOMEN:  Soft without hepatosplenomegaly or mass. There is no edema of the  ankles.   She was seen by  the respiratory therapist and she was unable to blow even  100 on peak flows. The color, however, was good.   ADMISSION DIAGNOSES:  1. Asthmatic bronchitis/chronic obstructive pulmonary disease exacerbation.  2. Tobacco use disorder.  3. Probably a small ventriculoseptal defect.   PLAN:  Admission to hospital, on oxygen at 2 L/min., with nebulizer  treatments of albuterol and Atrovent q.4 h.  Chest x-ray pending as are CBC,  MET-7, and BMP.  Will start her on steroids IV.  Avoid LEVAQUIN since she is  allergic to this and AVELOX; will use Rocephin 1 gm IV q.24 h. instead.  Since she has already been on Zithromax, atypical should have been covered,  and by the sounds of things most of her problem at this  point is  inflammation.     ___________________________________________                                         Mila Homer Sudie Bailey, M.D.   SDK/MEDQ  D:  11/19/2003  T:  11/19/2003  Job:  [id number]   cc:   Angus G. Renard Matter, M.D.  [address]  [place]  [place] [address]  Fax: [phone]

## 2011-04-19 NOTE — H&P (Signed)
[name] [name], [name] [name]                 ACCOUNT NO.:  [id number]   MEDICAL RECORD NO.:  [id number]          PATIENT TYPE:  INP   LOCATION:  A220                          FACILITY:  [facility]   PHYSICIAN:  Angus G. Renard Matter, MD   DATE OF BIRTH:  Apr 01, 1951   DATE OF ADMISSION:  03/27/2006  DATE OF DISCHARGE:  LH                                HISTORY & PHYSICAL   A 60 year old white female admitted with productive cough and dyspnea over  the period of several days.  The patient was seen initially in the office  with history of productive cough and dyspnea for approximately 1 weeks.  She  had been treated with antibiotics, Levaquin, and continued to have symptoms  of congestion, productive cough, and shortness of breath.  O2 saturation  remained in the 90s.  She had a chest x-ray several days ago which showed  evidence of emphysema but no infiltrate.  Repeat chest x-ray today showed  evidence of right lower lobe air space disease, probable pneumonia.  The  patient was admitted to the hospital for more intense treatment.   SOCIAL HISTORY:  The patient was a former cigarette smoker, does not smoke  now.   FAMILY HISTORY:  Not obtainable.   PAST MEDICAL AND SURGICAL HISTORY:  The patient has a history of:  1.  Chronic COPD.  2.  She does have a history of atrioseptal defect, is followed by [facility] for this.  3.  Pulmonary venous hypertension.  4.  Kyphoscoliosis.  5.  Osteoporosis, moderately severe.   ALLERGIES:  No known allergies.   MEDICATIONS:  1.  Levaquin 500 mg daily.  2.  Nebulizer treatments with albuterol and Atrovent 4 times a day.  3.  Mucinex 2 twice daily.  4.  Codal-DH.   REVIEW OF SYSTEMS:  HEENT: Negative.  CARDIOPULMONARY: The patient has  increased cough, productive, and dyspnea over the past few days. GI: No  irregularity or bleeding.  GU: No dysuria or hematuria.   PHYSICAL EXAMINATION:  GENERAL:  Uncomfortable white female.  VITAL SIGNS:  Blood  pressure ________, respirations ________, temperature  ________.  HEENT: Eyes PERRLA, TMs negative, oropharynx benign.  NECK:  Supple, no JVD or thyroid abnormalities.  LUNGS:  Rhonchi over the lower lung field.  HEART: Grade 3/6 systolic murmur at the sternal border.  ABDOMEN: Without organs or masses.  EXTREMITIES:  Free of edema.  THORACIC SPINE: Kyphoscoliosis.   DIAGNOSES:  1.  broncho pneumonia, right lower lobe.  2.  Atrioseptal defect.  3.  Kyphoscoliosis.  4.  Osteoporosis.      Angus G. Renard Matter, MD  Electronically Signed     AGM/MEDQ  D:  03/27/2006  T:  03/27/2006  Job:  [id number]

## 2011-04-19 NOTE — Consult Note (Signed)
[name] [name], [name] [name] NO.:  [id number]   MEDICAL RECORD NO.:  [id number]          PATIENT TYPE:  INP   LOCATION:  A220                          FACILITY:  [facility]   PHYSICIAN:  Vida Roller, M.D.   DATE OF BIRTH:  Oct 20, 1951   DATE OF CONSULTATION:  03/28/2006  DATE OF DISCHARGE:                                   CONSULTATION   HISTORY OF PRESENT ILLNESS:  Mrs. Schumpert is a 60 year old woman who has a  history of COPD, kyphoscoliosis and atrial septal defect, which appears to  be secundum.  She presents with a productive cough, with fever about 10 days  ago.  She was treated as an outpatient with p.o. antibiotics, but has  progressively gotten worse.  She was evaluated in the emergency department  on March 21, 2006 with increasing shortness of breath, which was evaluated  and she was sent home.  She saw Dr. Renard Matter yesterday and continued to have  worsening shortness of breath; denied any chest discomfort.  She was  admitted to the hospital for IV antibiotics.  She denies any PND or  orthopnea.  No lower extremity edema.  She denies any palpitations.   PAST MEDICAL HISTORY:  Significant for atrial septal defect, which appears  to be quite substantial.  She was seen at [facility] and evaluated  at that time.  An attempt was made at an [facility] device, which was  unsuccessful.  She was referred for surgical therapy, which she refused.  She has what appears to be, a very large (at least 3 cm) secundum ASD, with  primarily left-to-right flow.  There is a history of pulmonary hypertension,  which is described as mild; though it has not been well documented in the  past.  I am not sure that she has ever had a full cardiac catheterization to  look at her Q-S ratio.  She has kyphoscoliosis and osteoporosis, which I  think she has had since she was a child.   CURRENT MEDICATIONS:  1.  Truman Hayward (which was started on March 21, 2006).  2.  Tussinex DM.  3.   Mucinex.  4.  Tylenol.  5.  Xanax.   ALLERGIES:  LEVAQUIN, AVELOX.   CURRENT MEDICATIONS IN HOSPITAL:  1.  Mucinex 600 mg twice a day.  2.  Guaifenesin 5 mL four times a day.  3.  Zopinex inhaler q,.4 h.  4.  Zosyn 3.375 g IV q.8 h.  5.  Half-normal saline 20 cc/hr.   ECHOCARDIOGRAM:  The most recent echocardiogram is from 2005, which showed  the large secundum ASD with a left-to-right shunt.  She has a dilated right  ventricle, with a dilated main pulmonary artery; with good LV systolic  function, trivial mitral regurgitation, and what is felt to be mild  tricuspid regurgitation.   SOCIAL HISTORY:  She lives in [place] with her husband.  She is married.  She has a child.  She smokes, but she says she quit a week ago; but has a  30+ pack/yr smoking history.  Occasionally uses alcohol.  Does not use any  illicit drugs.   FAMILY HISTORY:  Her mother is alive; has hypertension.  Father died at age  19 from complications of colon cancer.  She has a sister who is alive and  who has no medical problems.   REVIEW OF SYSTEMS:  GENERAL:  No fevers, chills or sweats.  She tells me the  highest her temperature ever got was 100.5.  HEENT:  She does have headaches  and sinus tenderness, but no sinus discharge.  No changes in her voice.  No  photophobia.  No visual or hearing problems.  SKIN:  No rashes.  CARDIOPULMONARY:  She has no chest pain.  She does have shortness of breath  and occasionally has a productive cough of greenish sputum.  No wheezing.  No orthopnea, PND, edema or palpitations.  No syncope.  GU:  No urinary  frequency or urgency.  No numbness or weakness.  No arthralgias or myalgias.  No nausea, vomiting, diarrhea or bright red blood per rectum; no melena,  hematochezia, GERD symptoms.  She does have some decrease in her appetite.  No polyuria or polydipsia.  The remainder of the review of systems is  negative.   PHYSICAL EXAMINATION:  VITAL SIGNS:  She is afebrile.   She weighs 91 pounds.  Her pulse is 91 and regular.  Respirations are 18-20.  Blood pressure is  86/50.  She is saturating 94% on room air.  In general, she is a thin female  who is in no apparent distress.  Alert and oriented x4.  Does not appear to  be markedly dyspneic.  HEENT:  Unremarkable.  NECK:  She has about 3 cm of  JVP at 30 degrees.  She does not have any bruit.  CARDIOVASCULAR:  Tachycardic.  There is a too and fro murmur, that is heard best in the upper  left sternal border.  There is a soft first heart sound and a relatively  loud second heart sound; which appears to be a predominately a PT component.  Her lungs have decreased breath sounds throughout; with extensive  respiratory wheezing.  There are rales bilaterally at the bases.  ABDOMEN:  Soft and nontender. There is no obvious hepatosplenomegaly. GU, breast and  rectal examination were deferred.  Her lower extremities are without  clubbing, cyanosis or edema.  Her pulse are 1+ throughout.  NEUROLOGIC:  Unremarkable.  MUSCULOSKELETAL:  Markedly abnormal, with evidence of  significant kyphoscoliosis with thoracic abnormality.  A chest x-ray shows  increased infiltrates bilaterally, concerning for pneumonia versus pulmonary  edema.  Her electrocardiogram  showed sinus rhythm with frequent PACs and an  occasional PVC.  She has bi-atrial enlargement, right axis deviation and RSR  prime pattern in V1, with not clear criteria for RVH.  I do not have any old  EKGs for comparison.   LABORATORY STUDIES:  White blood cell count 12.2 with a left shift.  H&H  14.0 and 44.0, platelet count 210.  Sodium 131, potassium 3.1, chloride 98,  bicarbonate 26, BUN 16, creatinine 0.7, blood sugar 162 (non-fasting).  LFTs  are normal.  Urinalysis shows some white blood cells, and a very high  specific gravity, or rare bacteria.   IMPRESSION:  This is a lady with, what appears to be, a pulmonary infection (likely an exacerbation of her COPD).   She has ongoing tobacco abuse, and is  being treated with antibiotics   The other issue is this large atrial septal defect with pulmonary  hypertension.  I talked with her at length about her situation, from the  point of view of whether or not this is something that she would like pursue  to treat.  She is adamant that she does not want surgical therapy for this.  The reasons for that are not quite clear to me, and I think that there is  probably some major portion of  denial.  With regard to the severity of her  ASD; also she really needs to  stop smoking, as this is obviously going to particularly exacerbate her  problem.  With regard to her kyphoscoliosis and her osteoporosis, it might  be reasonable to think about treating that.   Given her relatively slight build in the likelihood the progression of this  ages through the post-menopausal.      Vida Roller, M.D.  Electronically Signed     JH/MEDQ  D:  03/28/2006  T:  03/28/2006  Job:  [id number]

## 2011-04-19 NOTE — Group Therapy Note (Signed)
[name] [name], [name]                 ACCOUNT NO.:  [id number]   MEDICAL RECORD NO.:  [id number]          PATIENT TYPE:  INP   LOCATION:  A220                          FACILITY:  [facility]   PHYSICIAN:  Angus G. Renard Matter, MD   DATE OF BIRTH:  [birth year]   DATE OF PROCEDURE:  04/01/2006  DATE OF DISCHARGE:  04/01/2006                                   PROGRESS NOTE   This patient was admitted with right lower lobe pneumonia, COPD and  exacerbation.  Does have atrial septal defect, kyphoscoliosis.  Is feeling  some better, still coughing intermittently.  Repeat chest x-ray does show  improvement in bilateral pneumonia, cardiomegaly without CHF.   OBJECTIVE:  VITAL SIGNS:  Blood pressure 105/70, respirations 24-35,  temperature 97.3.  CARDIAC:  Regular rhythm, a grade 3/6 systolic murmur at the left sternal  border.  LUNGS:  With diminished breath sounds, occasional rhonchi.  ABDOMEN:  No palpable organs or masses.   ASSESSMENT:  The patient was admitted with right lower lobe pneumonia.  She  does have atrial septal defect, kyphoscoliosis, and is feeling some better.   PLAN:  Continue current regimen.      Angus G. Renard Matter, MD  Electronically Signed     AGM/MEDQ  D:  04/01/2006  T:  04/01/2006  Job:  [id number]

## 2011-04-19 NOTE — Group Therapy Note (Signed)
NAMEKAORI, [name] NO.:  [id number]   MEDICAL RECORD NO.:  [id number]                   PATIENT TYPE:  INP   LOCATION:  A211                                 FACILITY:  [facility]   PHYSICIAN:  Angus G. Renard Matter, M.D.              DATE OF BIRTH:  28-Aug-1951   DATE OF PROCEDURE:  11/22/2003  DATE OF DISCHARGE:                                   PROGRESS NOTE   SUBJECTIVE:  This patient was admitted to the hospital with asthmatic  bronchitis.  She was seen by cardiology, has known ASD seen on  echocardiogram in 1999, ongoing tobacco abuse, has evidence of pulmonary  hypertension on physical exam.  She has declined surgery up to now.   OBJECTIVE:  Vital signs:  Blood pressure 118/84, respirations 20, pulse 88,  temperature 97.2.  Heart:  Regular rhythm.  Lungs:  Clear to P&A.  The  patient has a 3/6 systolic murmur.   ASSESSMENT:  The patient was admitted with bronchitis, chronic obstructive  pulmonary disease.  She does have atrioseptal defect.   PLAN:  Continue current regimen.  The patient will obtain echocardiogram and  continue regimen.  Discussion held with the patient regarding the  advisability of atrioseptal surgical repair at future date.      ___________________________________________                                            Ishmael Holter. Renard Matter, M.D.   AGM/MEDQ  D:  11/22/2003  T:  11/22/2003  Job:  [id number]

## 2011-04-19 NOTE — Procedures (Signed)
NAMEFRAIDA, [name] [name] NO.:  [id number]   MEDICAL RECORD NO.:  [id number]          PATIENT TYPE:  INP   LOCATION:  A220                          FACILITY:  [facility]   PHYSICIAN:  Dyer Bing, M.D. [facility] OF BIRTH:  [birth year]   DATE OF PROCEDURE:  03/31/2006  DATE OF DISCHARGE:  04/01/2006                                  ECHOCARDIOGRAM   CLINICAL DATA:  A 60 year old woman with ASD.   M-mode aorta 2.3,  left atrium 4.5, septum 1.3, posterior wall 1.1, LV  diastole 3.7, LV systole 2.7, RV diastole 4.6.   1.  Technically adequate echocardiographic study.  2.  Mild left atrial enlargement; moderate right atrial dilatation; moderate      right ventricular enlargement with borderline hypertrophy and normal      systolic function.  3.  Mild thickening of the aortic valve with normal function.  4.  Normal aortic root.  5.  Mild mitral valve thickening with mild prolapse; mitral regurgitation is      present but is difficult to grade.  6.  Mild tricuspid valve thickening; minimal regurgitation.  7.  Mild dilatation of the proximal pulmonary artery; pulmonic valve is      normal.  8.  Normal left ventricular size; mild septal hypertrophy; abnormal septal      motion with moderate septal flattening.  Overall LV systolic function is      normal.  9.  There is a sizeable ASD present with left-to-right flow and increased      flow from the pulmonic veins into the left atrium.  10. IVC diameter is at the upper limit of normal; the decrease in end      diameter with inspiration is low normal.  11. Comparison to prior study of November 22, 2003:  No significant interval      change.      [place] Bing, M.D. [facility]  Electronically Signed     RR/MEDQ  D:  03/31/2006  T:  04/01/2006  Job:  [id number]

## 2011-04-19 NOTE — Discharge Summary (Signed)
[name] [name], [name] [name]                 ACCOUNT NO.:  [id number]   MEDICAL RECORD NO.:  [id number]          PATIENT TYPE:  INP   LOCATION:  A220                          FACILITY:  [facility]   PHYSICIAN:  Angus G. Renard Matter, MD   DATE OF BIRTH:  May 27, 1951   DATE OF ADMISSION:  03/27/2006  DATE OF DISCHARGE:  05/01/2007LH                                 DISCHARGE SUMMARY   DISCHARGE DIAGNOSES:  1.  Bronchopneumonia.  2.  Chronic obstructive pulmonary disease.  3.  Osteoporosis.  4.  Kyphosis.  5.  Atrial septal defect.   CONDITION:  Stable and improved at the time of discharge.   This 60 year old white female was admitted with productive cough and dyspnea  over a period of several days.  The patient was seen initially in the office  with a history of productive cough and dyspnea for approximately one week.  She had been treated with antibiotics, Levaquin, and continued to have  symptoms of congestion, productive cough and shortness of breath.  O2  saturation remained in the 90s.  Patient had a chest x-ray which showed  evidence of emphysema but no infiltrate several days prior to admission.  Then a repeat chest x-ray showed right lower lobe air space disease with  probable pneumonia.  Patient was admitted to the hospital for more intense  treatment.   PHYSICAL EXAMINATION ON ADMISSION:  GENERAL APPEARANCE:  An uncomfortable  white female .  VITAL SIGNS:  Blood pressure 101/67, respirations 18, temperature 97.1.  HEENT:  PERRLA, TMs negative.  Oropharynx benign.  NECK:  Supple, no JVD, or thyroid abnormalities.  LUNGS:  Rhonchi over lower lung field.  CARDIOVASCULAR:  Grade 3/6 systolic murmur at the sternal border.  ABDOMEN:  No palpable organs or masses.  EXTREMITIES:  Free of edema.  THORACIC SPINE:  Kyphoscoliosis.   LABORATORY DATA:  Admission CBC with wbc 12,200, hemoglobin 14.5, hematocrit  43.4; 91% neutrophils, 5 lymphocytes.  Chemistries with sodium 131,  potassium 3.9,  chloride 98, CO2 28, glucose 162, BUN 16, creatinine 0.7,  calcium 8.9.  Subsequent chemistries on March 29, 2006, with sodium 136,  potassium 3.4, chloride 102, CO2 27, glucose 99, BUN 7, creatinine 0.7,  calcium 8.6.  Liver enzymes with SGOT 17, SGPT 13, alkaline phosphatase 70,  bilirubin 0.9.  Urinalysis essentially negative.  BNP 126.   X-RAYS:  Chest x-ray initially showed evidence of lower lobe air space  disease.  Subsequent chest x-ray showed improving bilateral pneumonia,  cardiomegaly without CHF.   HOSPITAL COURSE:  The patient, at the time of her admission, was placed on  half normal saline, KVO rate.  Vital signs were monitored q.i.d.  She was  placed on nasal O2 at two liters per minute.  [facility] standing orders.  Nebulizer treatments every four hours while awake, albuterol/Atrovent.  She  was placed on IV Zosyn 3.375 g every eight hours.  Humibid LA one b.i.d.  Robitussin teaspoon q.i.d.  She is also subsequently placed on Xopenex  nebulizer treatments.  Her albuterol and Atrovent was discontinued.  Her  Zosyn was discontinued  on March 31, 2006.  She was placed on Augmentin 875  p.o. b.i.d. on this date.  Ambulation was started.  Oxygen was continued.  Potassium supplement was continued.  A 2-D echo was ordered.  Patient showed  progressive improvement.  She has a large secundum ASD with a left-to-right  shunt, dilated right ventricle, dilated main pulmonary artery, good left  ventricular systolic function, trivial mitral regurgitation and mild  tricuspid regurgitation.  Patient is adamant about not wanting to have  surgery.  This was discussed fully with her by cardiology.  They recommended  she should stop smoking.  Patient improved and was able to be discharge from  hospital after five days hospitalization.  She was discharged on Augmentin  875 mg b.i.d., Atrovent HFA inhaler q.4h. p.r.n. and was instructed to  continue her nebulized treatments, Mucinex, and cough  medication and to  return to the office as an outpatient for follow-up.      Angus G. Renard Matter, MD  Electronically Signed     AGM/MEDQ  D:  05/08/2006  T:  05/08/2006  Job:  [id number]

## 2011-04-19 NOTE — Procedures (Signed)
[name] [name], AWAN                 ACCOUNT NO.:  [id number]   MEDICAL RECORD NO.:  [id number]          PATIENT TYPE:  INP   LOCATION:  A220                          FACILITY:  [facility]   PHYSICIAN:  Edward L. Juanetta Gosling, M.D.DATE OF BIRTH:  [birth year]   DATE OF PROCEDURE:  03/29/2006  DATE OF DISCHARGE:                                EKG INTERPRETATION   The rhythm is sinus rhythm with probable PVCs.  There appears to be left  atrial enlargement.  There may be a right ventricular conduction delay.  Abnormal electrocardiogram.      Oneal Deputy. Juanetta Gosling, M.D.  Electronically Signed     ELH/MEDQ  D:  03/29/2006  T:  03/30/2006  Job:  [id number]

## 2011-04-19 NOTE — Consult Note (Signed)
NAME:  [name] [name], [name] [name] NO.:  [id number]   MEDICAL RECORD NO.:  [id number]                   PATIENT TYPE:  INP   LOCATION:  A211                                 FACILITY:  [facility]   PHYSICIAN:  Vida Roller, M.D.                DATE OF BIRTH:  01-22-1951   DATE OF CONSULTATION:  11/21/2003  DATE OF DISCHARGE:                                   CONSULTATION   PRIMARY CARE PHYSICIAN:  Angus G. Renard Matter, M.D.   CARDIOLOGIST:  Minerva Bing, M.D.   HISTORY OF PRESENT ILLNESS:  [name] [name] is a 60 year old woman who is  admitted for asthmatic bronchitis and COPD exacerbation on the 18th of  December 2004. She initially presented with shortness of breath and  wheezing. She was given IV steroids, albuterol and Atrovent nebulizers.  During the nebulizer treatment she had the onset of racing heart beat with  lightheadedness which resolved in about 20 minutes. She denies any chest  pain or shortness of breath, although she has chronic shortness of breath.  She has an established diagnosis of ASD which has not been repaired  secondary to her desire not to have heart surgery.   PAST MEDICAL HISTORY:  Significant for atrioseptal defect, which is well  known on previous echocardiograms. The ones available to me are from June  1999 which show moderate left atrial and left ventricular enlargement, a  large ASD with normal left ventricular systolic function and tricuspid  regurgitation which is mild. Chronic obstructive pulmonary disease, ongoing  tobacco abuse, scoliosis, palpitations, mild hypertension, history of  anxiety disorder, history of abnormal thyroid function studies, and unknown  lipid status. She was on no medications when she was admitted. She was put  on albuterol and Atrovent nebulizers q.4h., Rocephin 1 gm daily, guaifenesin  1200 mg b.i.d., Solu-Medrol 125 mg IV q.6h., and half-normal saline.   REVIEW OF SYSTEMS:  Essentially noncontributory aside  from that discussed in  the history of present illness. Of note, however, is that the fact that this  is a patient who appears to be chronically dyspneic.   PHYSICAL EXAMINATION:  GENERAL: On physical exam, she is a thin white female  who is in no apparent distress.  She is alert and oriented times four, a  reasonably good historian. She does have mild conversational dyspnea.  VITAL SIGNS: Her heart rate is 115 and regular, respiratory rate is between  18 and 24, blood pressure 116/68, and she weighs 104 pounds. She is  saturating at 94% on room air.  HEENT: Unremarkable.  NECK: Supple. There is no jugular venous distention or carotid bruits,  although she has relatively impressive A waves. No lymphadenopathy.  CHEST: Decreased breath sounds throughout with moderate wheezing.  CARDIOVASCULAR EXAM: She has a normal point of maximal impulse which is  slightly sustained. She has a right ventricular heave. She has a soft first  heart sound. She has a fixed second heart sound. She has no third or fourth  sound. She has a 3/6 holosystolic murmur which is heard best along the  lateral aspect of the sternum on the left side.  ABDOMEN: Soft, nontender  with normoactive bowel sounds.  GU/RECTAL/BREAST EXAM: Deferred.  EXTREMITIES: She has very impressive clubbing in both hands with no obvious  hypertrophic osteopathy in her lower extremities. She has 1 to 2+ pulses  throughout.  NEUROLOGIC: Nonfocal.   Chest x-ray shows cardiomegaly with pulmonary venous congestion. There is  some evidence of an increased interstitial pattern, which is not consistent  with congestive heart failure.  She has marked convexed right scoliosis in  her mid thoracic spine. Her electrocardiogram is interesting. She has sinus  tachycardia at a rate of 103 with biatrial enlargement. She has moderate  right axis deviation. Her PR interval is 210 milliseconds, QR restorations  90 milliseconds, and QT duration is 432  milliseconds. She has an RSR prime  in V1 consistent with right ventricular conduction delay.   LABORATORY:  White blood cell count 9.5, H&H 14/44, platelet count 223,000.  Sodium 140, potassium 3.7, chloride 101, bicarbonate 31, BUN 10, creatinine  0.7, and blood sugar of 101. She had a single set of cardiac enzymes which  were not consistent with acute myocardial infarction.  Her brain natriuretic  peptide is 109.   ASSESSMENT:  This is a woman who has known atrioseptal defect with right  ventricular and right atrial enlargement on echocardiogram who has  concerning attitude towards her disease process. She has delayed her surgery  for an extended period of time. She has known she has had this atrioseptal  defect, from what I can tell, since 1984 and has delayed the surgery since  then fully aware that delaying the surgery potentially creates problems for  her. She has ongoing tobacco abuse and chronic obstructive pulmonary  disease.  She has evidence of pulmonary hypertension on physical exam and  this is very concerning for Eisenmenger's syndrome. She has tachycardia  which is probably exacerbated by her beta agonist therapy and appears to  have evidence of at least a community-acquired bronchitis and potentially  pneumonia.   Our recommendations are straight forward.  Aggressively treat her pulmonary  infection, try to minimize her beta agonist. Would switch from albuterol to  Xopenex and she needs to have her bronchitis successfully treated. When this  is completed, we need to have a serious evaluation for potential for an  atrioseptal defect repair in the existence of Eisenmenger's syndrome.      ___________________________________________                                            Vida Roller, M.D.   JH/MEDQ  D:  11/21/2003  T:  11/21/2003  Job:  [id number]

## 2011-04-19 NOTE — Group Therapy Note (Signed)
[name] [name], [name] [name]                 ACCOUNT NO.:  [id number]   MEDICAL RECORD NO.:  [id number]          PATIENT TYPE:  INP   LOCATION:  A220                          FACILITY:  [facility]   PHYSICIAN:  Angus G. Renard Matter, MD   DATE OF BIRTH:  06-11-1951   DATE OF PROCEDURE:  DATE OF DISCHARGE:                                   PROGRESS NOTE   This patient has had a productive cough over the past few days.  She was  initially seen in the office and started on antibiotics.  She had a chest x-  ray which showed evidence of air space disease and pneumonia, right lower  lobe.  The patient does have a history of an atrial-septal defect,  kyphoscoliosis and osteoporosis.  The patient remains febrile.   OBJECTIVE:  VITAL SIGNS:  Blood pressure 93/62, respirations 20, pulse 103,  temp 100.9.   The patient's CBC:  WBC is 12,200 with a hemoglobin of 14.5, hematocrit  43.4.  Urinalysis is negative.   HEART:  Regular rhythm.  The patient has a 3/6 systolic murmur at the base  of the heart.  LUNGS:  Rhonchi bilaterally.  Kyphotic thoracic spine.   ASSESSMENT:  Patient admitted with right lower lobe pneumonia.  She does  have a history of emphysema, osteoporosis, kyphosis, an atrial-septal  defect.   PLAN:  To continue current IV fluids, continue IV antibiotics.  We will  obtain a cardiology consult.      Angus G. Renard Matter, MD  Electronically Signed     AGM/MEDQ  D:  03/28/2006  T:  03/28/2006  Job:  [id number]

## 2011-04-19 NOTE — Assessment & Plan Note (Signed)
[facility]                            CARDIOLOGY OFFICE NOTE   ILEANA, [name] [name]                        MRN:          [id number]  DATE:02/24/2007                            DOB:          [birth year]    [name] [name] is a 60 year old married white female who has been a patient  of ours in [place], [place]. She wished to come down here  today to establish with [place] as her cardiologists here.   PAST MEDICAL HISTORY:  Significant for:  Atrial septal defect of the secundum type. Her last objective evaluation  of this was March 20, 2004, when she had catheterization at [facility]. At that time, she had right ventricular  pressure of 39, right atrial pressure of 15, left __________  pulmonary  vein sat of 94% and was found to have an atrial septal defect that was  larger than 30-mm in size. They tried to occlude this with an Amplatz  sizing balloon, but it was unsuccessful.   Apparently, it had taken a long time to even talk her into this  procedure. She clearly does not want to have surgery as mentioned in the  chart throughout.   She has had some pulmonary hypertension by examination with Dr. Dietrich Pates  on her last visit in April of 2006.   Her pulmonary status was not helped by the fact that she continued to  smoke. Apparently, she has quit since developing pneumonia in April of  2007.   She has had no orthopnea, PND, peripheral edema. She does have dyspnea  on exertion, but that has not changed significantly in the last two  years.   PAST MEDICAL HISTORY:  She has no known drug allergies.   CURRENT MEDICATIONS:  She takes Unithroid 25 mcg a day.   She does not use any illicit drugs. She does have three alcoholic  beverages a day. She enjoys walking her dog.   PAST SURGICAL HISTORY:  She has had, other than the procedure at [facility], she has had no surgeries.   FAMILY HISTORY:  Is  noncontributory.   SOCIAL HISTORY:  She is a Futures trader. She is married and has one child.  She lives in [place].   REVIEW OF SYSTEMS:  Is negative other than the HPI.   Her blood pressure today is 130/80. Her pulse is 91 and regular. Her EKG  shows normal sinus rhythm with bi-atrial enlargement with a rightward  axis. She has an RSR prime in V1 with a long prolonged QTC of 464  milliseconds. She has diffuse ST segment depression inferior lateral  that is mild.  HEENT: Normocephalic, atraumatic. PERRLA. Extra-ocular movements intact.  She wears glasses.  Facial symmetry is normal. Dentition is  satisfactory.  NECK: Supple. Carotid upstrokes are equal bilaterally without bruits.  There is no JVD.  Thyroid is not enlarged. Trachea is midline.  LUNGS:  Are clear.  HEART: Reveals precordium that is active with a right ventricular lift.  She has a normal S1, widely split S2 that is fixed. She has a  systolic  murmur at 2/6 along the left sternal border. It does not change with  inspiration.  ABDOMEN: Soft with good bowel sounds. There is no midline bruit. There  is no hepatomegaly.  EXTREMITIES: Reveals no cyanosis, clubbing or edema. Pulses are present.  She has delayed capillary reflux with some dependent rubor.  NEURO: Intact.   ASSESSMENT/PLAN:  I have had a long talk with [name] [name] today. I have  recommended surgical consideration for repair of this large secundum  atrial and septal defect. She already has evidence of pulmonary  hypertension, which is only going to get worse. I told her within the  next ten years she could be disabled from this. Much of this is probably  still reversible.   We might even be able to consider an evaluation to have a left lateral  thoracotomy for repair. Hope this would not require sternotomy, which  she seems to be very afraid of.   She said she would think about it. I think leaving the clinic however  she would probably not consider it even  at this point.   I will schedule her for followup in a year.   I found out she had an echo a couple of weeks ago at [facility] when she  was in apparently with pneumonia. Will try to retrieve this.     Thomas C. Daleen Squibb, MD, [facility]  Electronically Signed    TCW/MedQ  DD: 02/24/2007  DT: 02/24/2007  Job #: [id number]   cc:   Angus G. Renard Matter, MD

## 2011-04-19 NOTE — Discharge Summary (Signed)
NAMECARLOTTA, [name] [name] NO.:  [id number]   MEDICAL RECORD NO.:  [id number]                   PATIENT TYPE:  INP   LOCATION:  A211                                 FACILITY:  [facility]   PHYSICIAN:  Angus G. Renard Matter, M.D.              DATE OF BIRTH:  1951/09/01   DATE OF ADMISSION:  11/19/2003  DATE OF DISCHARGE:  11/23/2003                                 DISCHARGE SUMMARY   This 60 year old white female admitted November 19, 2003 and discharged  November 23, 2003; a 4-days hospitalization.   DIAGNOSES:  1. Chronic obstructive pulmonary disease.  2. Atrioseptal defect.  3. Chronic tobacco use.   CONDITION:  Stable and improved at the time of discharge.   HISTORY:  This 60 year old female presented to the emergency room with  shortness of breath and wheezing.  She had been seen earlier in the ED and  been placed on Zithromax for bronchitis; had gotten worse. She continues to  smoke heavily, averaging 1/2-pack-a-day.  Does have a history of atrioseptal  defect.  Had been seen by Dr. [place] Bing, cardiologist for this. She  was coughing up thick sputum.   PHYSICAL EXAMINATION:  GENERAL:  A pleasant female in no acute distress.  VITAL SIGNS:  Blood pressure 129/95, respirations 22, heart rate 99.  HEENT:  Eyes PERRLA.  TMs negative.  Oropharynx benign.  NECK:  Supple. No JVD or thyroid abnormalities.  LUNGS:  Clear to P&A.  CARDIOVASCULAR:  The patient has a 2/6 ejection systolic murmur at the base  of the heart.  ABDOMEN:  No palpable organs or masses.  EXTREMITIES:  No ankle edema.   LABORATORY DATA:  CBC:  WBC 9500, hemoglobin 14.7, hematocrit 44.3.  Chemistries:  Sodium 140, potassium 3.7, chloride 101, CO2 31, glucose 101.  BUN 10, creatinine 0.7.  Cardiac enzymes:  CPK 28, CPK/MB 2.5, troponin  0.02.  Electrocardiogram:  Sinus tachycardia, left atrial enlargement, ST  elevation.  Chest x-ray:  Cardiomegaly with pulmonary vascular congestion,  marked convex rightward scoliosis of the thoracic spine.  Echocardiogram:  Large atrioseptal defect, good left ventricular function, right ventricle  significantly dilated.   HOSPITAL COURSE:  The patient, at the time of her admission, was placed on  nasal O2 at 2 liters per minute.  She was given IV Solu-Medrol, albuterol  Atrovent inhaler, IV rocephin 1 gm q.24h., guaifenesin 1200 mg twice a day.  The patient showed progressive improvement during the hospital stay. She was  seen in consultation by [facility] and advised to look into have the  septal defect repaired  The patient was placed on Xopenex nebulizer  treatments, 1.25 mg q.6h. She was placed on prednisone 40 mg daily,  continued on Zithromax in addition to her rocephin 500 gm.  The patient  showed progressive improvement and was able to be discharged after 4 days  hospitalization.   DISCHARGE MEDICATIONS:  1. Humibid LA 2 b.i.d.  2. Medrol Dosepak 4 mg daily.  3. Zithromax 250 mg daily.  4. Codimal DH.  5. Albuterol inhaler.     ___________________________________________                                         Ishmael Holter Renard Matter, M.D.   AGM/MEDQ  D:  12/22/2003  T:  12/22/2003  Job:  [id number]

## 2011-04-19 NOTE — Group Therapy Note (Signed)
NAME:  [name] [name], [name] [name] NO.:  [id number]   MEDICAL RECORD NO.:  [id number]          PATIENT TYPE:  INP   LOCATION:  A220                          FACILITY:  [facility]   PHYSICIAN:  Catalina Pizza, M.D.        DATE OF BIRTH:  [birth year]   DATE OF PROCEDURE:  DATE OF DISCHARGE:                                   PROGRESS NOTE   SUBJECTIVE:  Ms. Gudino said that overnight that she slept well from  approximately 11 to 4 a.m., but then woke up with a coughing episode.  Feels  her breathing is about the same as it was before.  No other specific  complaints as far as chest pain or worsening problems with shortness of  breath.  She was admitted for air space disease, probable early  bronchopneumonia, but also could be in addition to a COPD exacerbation and  concern of some mild congestive heart failure worsened by her ASD.   OBJECTIVE:  VITAL SIGNS:  Temperature is 97.8, temperature max of 101.  Blood pressure is 97/56, pulse of 95, respirations of 18.  She was  saturating 89% this morning on room air, but on recheck has come up to 95 to  96%.  GENERAL:  This is a thin white female laying in bed in no acute distress.  HEENT:  Negative.  No change.  LUNGS:  Air movement throughout, no specific rhonchi appreciate.  Did have  some very mild expiratory wheeze at the base.  Had significant amount of  upper airway noise, especially with cough.  HEART:  Regular rhythm with approximate 3/6 to-and-fro systolic murmur, best  appreciate at the left upper sternal border.  EXTREMITIES:  No lower extremity edema.  NEUROLOGIC:  Cranial nerves II-XII intact.  No deficits appreciated.   LABORATORY DATA:  Laboratory work obtained:  CBC today shows white count of  6.7, hemoglobin of 13.3, platelet count of 208.  BMET:  Sodium of 136,  potassium of 3.4, chloride of 102, CO2 of 27, glucose 99, BUN of 7,  creatinine 0.7, calcium of 8.6.   IMPRESSION:  This is a 60 year old white female admitted for  possible  bronchopneumonia/chronic obstructive pulmonary disease exacerbation with  worsening cough and shortness of breath as her presenting complaint.   ASSESSMENT AND PLAN:  1.  Pneumonia.  We will continue on the antibiotics, Zosyn intravenously for      now, given the fact that she is allergic to Levaquin and Avelox.  She      has had decrease in her white count.  2.  Chronic obstructive pulmonary disease exacerbation.  She is to continue      on the Xopenex neb to help some of her shortness of breath.  We will      also give her O2 at 2 L on a p.r.n. basis to keep her saturations      greater than 92%.  We will ambulate in the hall and check her O2      saturations to see if appreciably dropping.  Discussed again need for  stopping smoking, which she states she has been off cigarettes for      approximately six days now.  Commended this fact.  3.  Smoking cessation.  As mentioned above.  4.  Atrial-septal defect.  She was evaluated by cardiology and I felt that      this was likely more of a chronic obstructive pulmonary disease type      exacerbation secondary to a pneumonia.  She has talked extensively with      cardiology for about having her atrial-septal defect corrected.  She is      not a candidate by catheter and would need surgical fixation for it.      The patient states she is not at this time ready for this, but      emphasized that some of her symptoms may be related to congestive heart      failure, although her BNP was only slightly elevated at 126.  She may      have a significant amount of pulmonary hypertension, and a 2-D      echocardiogram is ordered for further evaluation of this as suggested by      Dr. Dorethea Clan.  I will follow along with any  further recommendations that      cardiology may have.      Catalina Pizza, M.D.  Electronically Signed     ZH/MEDQ  D:  03/29/2006  T:  03/29/2006  Job:  [id number]

## 2011-04-19 NOTE — Procedures (Signed)
NAME:  [name] [name], BEISSEL NO.:  [id number]   MEDICAL RECORD NO.:  [id number]                   PATIENT TYPE:  INP   LOCATION:  A211                                 FACILITY:  [facility]   PHYSICIAN:  Willa Rough, M.D.                  DATE OF BIRTH:  1951/04/17   DATE OF PROCEDURE:  11/22/2003  DATE OF DISCHARGE:                                  ECHOCARDIOGRAM   INDICATIONS FOR PROCEDURE:  The patient has a known atrial septal defect and  this study is done for further evaluation.   2-D ECHOCARDIOGRAM:  Aorta 29 mm.   Aortic valve is grossly normal.   Left atrium is dilated 43 mm.   Mitral valve:  The mitral valve is grossly normal.   Left ventricle:  End diastolic dimension is 35 mm. End systolic dimension is  22 mm. Wall thickness is 10 mm. Wall motion of the left ventricle is good.  The ejection fraction is 60% to 65%. There are no significant focal wall  motion abnormalities. There is some paradoxical septal motion related to the  atrial septal defect hemodynamics.   Right ventricle:  The right ventricle is markedly enlarged and measures  approximately 50 mm. There is relatively good right ventricular function.   Tricuspid valve:  Normal.   Pulmonic valve:  Normal.   Pericardial effusion:  There is no significant pericardial effusion.   Atrial septum: The atrial septum drops out and this is very well seen in the  subcostal view. Analysis of color flow in this area is quite difficult.  Pulsed Doppler in this area does not seem to show the shunt direction at  this time, to the best of my ability to assess this.   DOPPLER ANALYSIS:  There is trace tricuspid regurgitation. Right ventricular  systolic pressure cannot be well estimated from this study.   IMPRESSION:  The study shows good left ventricular function. The right  ventricle is significantly dilated but continues to move adequately.  Visually, there is a large atrial septal defect. There  is mild mitral  regurgitation. I cannot estimate right heart pressures from this study.      ___________________________________________                                            Willa Rough, M.D.   JK/MEDQ  D:  11/22/2003  T:  11/22/2003  Job:  [id number]   cc:   Angus G. Renard Matter, M.D.  [address]  [place]  [place] [address]  Fax: [phone]   Vida Roller, M.D.  Fax: [phone]

## 2011-04-19 NOTE — Group Therapy Note (Signed)
NAMEJELITZA, [name] [name] NO.:  [id number]   MEDICAL RECORD NO.:  [id number]                   PATIENT TYPE:  INP   LOCATION:  A330                                 FACILITY:  [facility]   PHYSICIAN:  Mila Homer. Sudie Bailey, M.D.           DATE OF BIRTH:  [birth year]   DATE OF PROCEDURE:  11/20/2003  DATE OF DISCHARGE:                                   PROGRESS NOTE   SUBJECTIVE:  She is generally feeling better today.   OBJECTIVE:  She is up in the room walking around.  Her color is good.  She  is not on oxygen.  Her temperature 96.9, pulse of 92, respiratory rate 20,  and blood pressure 104/68.  The lungs are clear throughout today.  Moving  air well. No intercostal retractions, and there is no use of accessory  muscles of respiration.  The heart has a regular rhythm with a rate of about  110 with at least a 2/6 systolic ejection murmur (after her nebulizer  treatment with Albuterol). Skin color is good as above.  The skin turgor is  normal.  Review of her blood work, from yesterday, showed the beta naturetic  peptide of 109.0.  Her white cell count was not elevated, but she had 6%  eosinophils.  Her O2 saturation was 92% on room air.   ASSESSMENT:  1. Asthma bronchitis.  2. Question of ventricular septal defect.  3. Tobacco use disorder.   PLAN:  At present continue the Solu-Medrol 125 mg IV q.6h. and  albuterol/Atrovent q.4h. while awake, guaifenesin 1200 mg b.i.d. and  Rocephin 1 gm IV q.24h.      ___________________________________________                                            Mila Homer. Sudie Bailey, M.D.   SDK/MEDQ  D:  11/20/2003  T:  11/20/2003  Job:  [id number]

## 2011-04-19 NOTE — Group Therapy Note (Signed)
NAME:  [name] [name], [name] [name]                           ACCOUNT NO.:  [id number]   MEDICAL RECORD NO.:  [id number]                   PATIENT TYPE:  INP   LOCATION:  A330                                 FACILITY:  [facility]   PHYSICIAN:  Angus G. Renard Matter, M.D.              DATE OF BIRTH:  March 16, 1951   DATE OF PROCEDURE:  11/21/2003  DATE OF DISCHARGE:                                   PROGRESS NOTE   SUBJECTIVE:  This patient was admitted with asthmatic bronchitis, COPD.   OBJECTIVE:  Vital signs:  Blood pressure 117/68, respirations 20, pulse 107,  temperature 97.4.  Lungs:  Rhonchi over lower lung fields.  Heart:  Regular  rhythm.  Abdomen:  No palpable organs or masses.   ASSESSMENT:  The patient was admitted with asthmatic bronchitis, chronic  obstructive pulmonary disease.   PLAN:  Continue current regimen.      ___________________________________________                                            Ishmael Holter. Renard Matter, M.D.   AGM/MEDQ  D:  11/21/2003  T:  11/21/2003  Job:  [id number]

## 2011-10-03 ENCOUNTER — Encounter: Payer: Self-pay | Admitting: Cardiology

## 2011-10-04 ENCOUNTER — Ambulatory Visit (INDEPENDENT_AMBULATORY_CARE_PROVIDER_SITE_OTHER): Payer: 59 | Admitting: Cardiology

## 2011-10-04 ENCOUNTER — Telehealth: Payer: Self-pay | Admitting: Cardiology

## 2011-10-04 ENCOUNTER — Encounter: Payer: Self-pay | Admitting: Cardiology

## 2011-10-04 VITALS — BP 116/62 | HR 68 | Ht 63.0 in | Wt 115.0 lb

## 2011-10-04 DIAGNOSIS — Q211 Atrial septal defect: Secondary | ICD-10-CM

## 2011-10-04 DIAGNOSIS — R000 Tachycardia, unspecified: Secondary | ICD-10-CM

## 2011-10-04 DIAGNOSIS — I2789 Other specified pulmonary heart diseases: Secondary | ICD-10-CM

## 2011-10-04 DIAGNOSIS — Q2111 Secundum atrial septal defect: Secondary | ICD-10-CM

## 2011-10-04 NOTE — Assessment & Plan Note (Signed)
She is concerned this is exacerbating her wheezing when she gets bronchitis. She is a former smoker but quit 5 years ago. She was originally put on low-dose beta blocker for resting tachycardia. This was prior to her surgery. We will discontinue over the next week to 10 days. She begins to have symptoms of tachycardia or resting heart rate is above 90 beats a minute she will restart.

## 2011-10-04 NOTE — Assessment & Plan Note (Signed)
Status post repair with no clinical evidence of recurrence.

## 2011-10-04 NOTE — Telephone Encounter (Signed)
Pt understands to take 1/2 of a 25mg  Metoprolol once a day for 10 days then discontinue. Mylo Red RN

## 2011-10-04 NOTE — Progress Notes (Signed)
HPI Ms. Gutt returns today for evaluation and management of her history of atrial septal defect, status post repair, resting tachycardia which is now resolved on low-dose beta blocker and postop.  He is doing remarkably well except for recurrent bouts of bronchitis.  She has a lot of wheezing with this and is concerned about the effect of the beta blocker.  She is only on 12.5 b.i.d. Of metoprolol.  She quit smoking 5 years ago.  She denies any orthopnea, PND, palpitations, syncope or presyncope, or edema. Past Medical History  Diagnosis Date  . Hypertension     pulmonary  . Atrial septal defect   . COPD (chronic obstructive pulmonary disease)   . Dyspnea   . Kyphoscoliosis     thoracic spine    No past surgical history on file.  No family history on file.  History   Social History  . Marital Status: Married    Spouse Name: N/A    Number of Children: N/A  . Years of Education: N/A   Occupational History  . Not on file.   Social History Main Topics  . Smoking status: Former Games developer  . Smokeless tobacco: Not on file  . Alcohol Use: Not on file  . Drug Use: Not on file  . Sexually Active: Not on file   Other Topics Concern  . Not on file   Social History Narrative  . No narrative on file    Allergies  Allergen Reactions  . Levofloxacin   . Moxifloxacin   . Sulfonamide Derivatives     Current Outpatient Prescriptions  Medication Sig Dispense Refill  . acetaminophen (TYLENOL) 500 MG tablet Take 500 mg by mouth as needed.        . ALPRAZolam (XANAX) 0.5 MG tablet Take 0.5 mg by mouth at bedtime as needed.        Marland Kitchen aspirin 81 MG tablet Take 81 mg by mouth daily.        Marland Kitchen levothyroxine (SYNTHROID, LEVOTHROID) 50 MCG tablet Take 50 mcg by mouth daily.        . metoprolol tartrate (LOPRESSOR) 25 MG tablet Take 12.5 mg by mouth 2 (two) times daily.        . VENTOLIN HFA 108 (90 BASE) MCG/ACT inhaler as directed.        ROS Negative other than HPI.    PE General Appearance: well developed, well nourished in no acute distress HEENT: symmetrical face, PERRLA, good dentition  Neck: no JVD, thyromegaly, or adenopathy, trachea midline Chest: symmetric without deformity Cardiac: PMI non-displaced, RRR, normal S1,S2 splits with inspiration. No right ventricular lift., no gallop or murmur Lung: clear to ausculation and percussion Vascular: all pulses full without bruits  Abdominal: nondistended, nontender, good bowel sounds, no HSM, no bruits Extremities: no cyanosis, clubbing or edema, no sign of DVT, no varicosities  Skin: normal color, no rashes Neuro: alert and oriented x 3, non-focal Pysch: normal affect, anxious Filed Vitals:   10/04/11 1002  BP: 116/62  Pulse: 68  Height: 5\' 3"  (1.6 m)  Weight: 115 lb (52.164 kg)    EKG  Labs and Studies Reviewed.   Lab Results  Component Value Date   WBC 9.5 09/02/2010   HGB 15.0 09/02/2010   HCT 44.0 09/02/2010   MCV 92.5 09/02/2010   PLT 165 09/02/2010      Chemistry      Component Value Date/Time   NA 136 09/02/2010 1224   K 3.9 09/02/2010 1224   CL  103 09/02/2010 1224   CO2 24 09/02/2010 1224   BUN 9 09/02/2010 1224   CREATININE 0.62 09/02/2010 1224      Component Value Date/Time   CALCIUM 8.6 09/02/2010 1224   ALKPHOS 92 09/02/2010 1224   AST 16 09/02/2010 1224   ALT 9 09/02/2010 1224   BILITOT 1.3* 09/02/2010 1224       Lab Results  Component Value Date   CHOL  Value: 114        ATP III CLASSIFICATION:  <200     mg/dL   Desirable  161-096  mg/dL   Borderline High  >=045    mg/dL   High        40/08/8118   Lab Results  Component Value Date   HDL 38* 11/09/2009   Lab Results  Component Value Date   LDLCALC  Value: 65        Total Cholesterol/HDL:CHD Risk Coronary Heart Disease Risk Table                     Men   Women  1/2 Average Risk   3.4   3.3  Average Risk       5.0   4.4  2 X Average Risk   9.6   7.1  3 X Average Risk  23.4   11.0        Use the calculated Patient Ratio  above and the CHD Risk Table to determine the patient's CHD Risk.        ATP III CLASSIFICATION (LDL):  <100     mg/dL   Optimal  147-829  mg/dL   Near or Above                    Optimal  130-159  mg/dL   Borderline  562-130  mg/dL   High  >865     mg/dL   Very High 78/03/6961   Lab Results  Component Value Date   TRIG 54 11/09/2009   Lab Results  Component Value Date   CHOLHDL 3.0 11/09/2009   Lab Results  Component Value Date   HGBA1C  Value: 5.5 (NOTE) The ADA recommends the following therapeutic goal for glycemic control related to Hgb A1c measurement: Goal of therapy: <6.5 Hgb A1c  Reference: American Diabetes Association: Clinical Practice Recommendations 2010, Diabetes Care, 2010, 33: (Suppl  1). 12/08/2009   Lab Results  Component Value Date   ALT 9 09/02/2010   AST 16 09/02/2010   ALKPHOS 92 09/02/2010   BILITOT 1.3* 09/02/2010

## 2011-10-04 NOTE — Patient Instructions (Signed)
Your physician has recommended you make the following change in your medication:  Decrease Metoprolol to 12.5mg  daily for 10 days then discontinue.  Restart only if resting heart rate is 90 or above or palpitations return.  Your physician wants you to follow-up in: 1 year with Dr. Daleen Squibb. You will receive a reminder letter in the mail two months in advance. If you don't receive a letter, please call our office to schedule the follow-up appointment.

## 2011-10-04 NOTE — Assessment & Plan Note (Addendum)
Stable on exam and by history. She only has mild dyspnea on exertion but no edema. No change in treatment.

## 2011-10-04 NOTE — Telephone Encounter (Signed)
Pt was in today and has question re meds and dosages

## 2011-12-26 ENCOUNTER — Ambulatory Visit (HOSPITAL_COMMUNITY)
Admission: RE | Admit: 2011-12-26 | Discharge: 2011-12-26 | Disposition: A | Discharge status: Home / Self Care | Payer: 59 | Source: Ambulatory Visit | Attending: Family Medicine | Admitting: Family Medicine

## 2011-12-26 ENCOUNTER — Other Ambulatory Visit (HOSPITAL_COMMUNITY): Payer: Self-pay | Admitting: Family Medicine

## 2011-12-26 DIAGNOSIS — M949 Disorder of cartilage, unspecified: Secondary | ICD-10-CM | POA: Insufficient documentation

## 2011-12-26 DIAGNOSIS — M899 Disorder of bone, unspecified: Secondary | ICD-10-CM | POA: Insufficient documentation

## 2011-12-26 DIAGNOSIS — R05 Cough: Secondary | ICD-10-CM | POA: Insufficient documentation

## 2011-12-26 DIAGNOSIS — R059 Cough, unspecified: Secondary | ICD-10-CM | POA: Insufficient documentation

## 2012-01-03 ENCOUNTER — Other Ambulatory Visit: Payer: Self-pay | Admitting: Cardiology

## 2012-01-06 ENCOUNTER — Telehealth: Payer: Self-pay | Admitting: Cardiology

## 2012-01-06 NOTE — Telephone Encounter (Signed)
01/06/12--pt calling stating she feels she is having reaction to metoprolol, SOB, and wants to know if she can try something else--pt states she tried to wean off lopressor last year, but went back on med as she developed chest pain--advised i would inform dr wall and his nurse about need to change med and if she has not heard back from [place] in 2 days to call back--if develops CP/SOB--go to nearest ED--pt agrees--NT

## 2012-01-06 NOTE — Telephone Encounter (Signed)
New Problem   Patient would like a return call regarding Metprolol medicine, she is concerned that she is having some side affects  Shortness of breath Weezing   Please return call to patient on cell#

## 2012-01-08 ENCOUNTER — Telehealth: Payer: Self-pay | Admitting: Cardiology

## 2012-01-08 NOTE — Telephone Encounter (Signed)
LMTCB Debbie Chaeli Judy RN  

## 2012-01-08 NOTE — Telephone Encounter (Signed)
F/U  Patient called back to note that she wants to keep both appnts, please do not cancel 2/27 with Dr. Daleen Squibb per patients!!!    01/15/2012 Wed 3:10p 15 OFFICE VISIT [[id number]] Alvester Morin [facility] [[id number]]       01/29/2012 Wed 2:45p 15 OFFICE VISIT [[id number]] WALL, THOMAS [[id number]] [facility] [[id number]] ROV, per pt call. MW

## 2012-01-08 NOTE — Telephone Encounter (Signed)
I had already told pt we would keep both appts until seen by PA.  Mylo Red RN

## 2012-01-08 NOTE — Telephone Encounter (Signed)
Follow- up: ° ° °Patient returned your phone call. Please call back. °

## 2012-01-08 NOTE — Telephone Encounter (Signed)
Pt  Calls today with shortness of breath at times ( no wheezing) while taking the Metoprolol 12.5mg  qam.  She had tried to wean it off in November-December.  But restarted it because of heart rate running more consistently over 90.   She uses her Ventolin inhaler 1-2 times per day.   She also has noted her blood pressure inceasing  139/80  156/90. She would like a sooner appt to discuss this with Dr. Daleen Squibb. 1. I will forward this message to Dr. Daleen Squibb.   2. I have made her an appt with Scott on 2/13 Pt agrees with plan and appt.  She is not experiencing any chest discomfort and is not short of breath at this time. Mylo Red RN

## 2012-01-09 ENCOUNTER — Ambulatory Visit (INDEPENDENT_AMBULATORY_CARE_PROVIDER_SITE_OTHER): Payer: 59 | Admitting: Adult Health

## 2012-01-09 ENCOUNTER — Encounter: Payer: Self-pay | Admitting: Adult Health

## 2012-01-09 DIAGNOSIS — I2789 Other specified pulmonary heart diseases: Secondary | ICD-10-CM

## 2012-01-09 DIAGNOSIS — R0609 Other forms of dyspnea: Secondary | ICD-10-CM

## 2012-01-09 DIAGNOSIS — R0789 Other chest pain: Secondary | ICD-10-CM

## 2012-01-09 MED ORDER — IPRATROPIUM-ALBUTEROL 18-103 MCG/ACT IN AERO: 2.0000 | INHALATION_SPRAY | Freq: Three times a day (TID) | RESPIRATORY_TRACT | Status: DC

## 2012-01-09 NOTE — Assessment & Plan Note (Signed)
Calcium channel blocker would be helpful with this as well. She will see Dr. Daleen Squibb and further recommendations will be made by him concerning this.

## 2012-01-09 NOTE — Progress Notes (Signed)
   HPI: [name] [name] is a 61 y/o patient of Dr. Daleen Squibb we are seeing at her request for complaints of wheezing and shortness of breath. She has a history of resting tachycardia with atrial septal defect repair; COPD and asthma. She was last seen by Dr. Daleen Squibb on 10/04/2011 at which time he asked her to wean off of the metoprolol she was taking as she was having similar complaints. However, she only did this for about 3 days, and did not notice any difference in her breathing or wheezing, so she restarted this medication. She continues to use ventolin inhaler prn. She has some chest pain associated with the wheezing but not severe or any that causes her concern. She is followed by Dr. Renard Matter for PCP.  Allergies  Allergen Reactions  . Levofloxacin   . Moxifloxacin   . Sulfonamide Derivatives     Current Outpatient Prescriptions  Medication Sig Dispense Refill  . acetaminophen (TYLENOL) 500 MG tablet Take 500 mg by mouth as needed.        . ALPRAZolam (XANAX) 0.5 MG tablet Take 0.5 mg by mouth at bedtime as needed.        Marland Kitchen aspirin 81 MG tablet Take 81 mg by mouth daily.        Marland Kitchen levothyroxine (SYNTHROID, LEVOTHROID) 50 MCG tablet Take 50 mcg by mouth daily.        Marland Kitchen albuterol-ipratropium (COMBIVENT) 18-103 MCG/ACT inhaler Inhale 2 puffs into the lungs 3 (three) times daily.  1 Inhaler  12    Past Medical History  Diagnosis Date  . Hypertension     pulmonary  . Atrial septal defect   . COPD (chronic obstructive pulmonary disease)   . Dyspnea   . Kyphoscoliosis     thoracic spine     ZOX:WRUEAV of systems complete and found to be negative unless listed above PHYSICAL EXAM BP 114/71  Pulse 80  Ht 5' 2.5" (1.588 m)  Wt 111 lb (50.349 kg)  BMI 19.98 kg/m2  General: Well developed, well nourished, in no acute distress Head: Eyes PERRLA, No xanthomas.   Normal cephalic and atramatic  Lungs: Inspiratory and expiratory wheezes with cough on inspiration. No rales or rhonchi. Heart: HRRR S1  S2, without MRG.  Pulses are 2+ & equal.            No carotid bruit. No JVD.  No abdominal bruits. No femoral bruits. Abdomen: Bowel sounds are positive, abdomen soft and non-tender without masses or                  Hernia's noted. Msk:  Back kyphosis is noted. normal gait. Normal strength and tone for age. Extremities: No clubbing, cyanosis or edema.  DP +1 Neuro: Alert and oriented X 3. Psych:  Good affect, responds appropriately   ASSESSMENT AND PLAN

## 2012-01-09 NOTE — Patient Instructions (Signed)
Your physician recommends that you schedule a follow-up appointment in: As previously scheduled  Your physician has recommended you make the following change in your medication:  - STOP Metoprolol - STOP Ventolin - START Combivent inhaler 2 puffs 3 times daily

## 2012-01-09 NOTE — Assessment & Plan Note (Signed)
Will wean her off BB completely and I have asked her not to restart it. I discussed the need to change her medication to a CCB, low dose cardizem but she wants to wait to see how she feels before starting something new. She has an appointment with Dr. Daleen Squibb on Feb 27th. She wants to wait to see him before starting something new. She asked about starting steroid dose pack for wheezing I have referred her back to Dr. Renard Matter for this, but have started her on a combivent inhaler until she follows up with him. She may need referral to pulmonologist for further evaluation and treatment.

## 2012-01-15 ENCOUNTER — Ambulatory Visit: Payer: 59 | Admitting: Physician Assistant

## 2012-01-29 ENCOUNTER — Ambulatory Visit: Payer: 59 | Admitting: Cardiology

## 2012-02-19 ENCOUNTER — Ambulatory Visit: Payer: 59 | Admitting: Cardiology

## 2012-03-28 ENCOUNTER — Emergency Department (HOSPITAL_COMMUNITY)
Admission: EM | Admit: 2012-03-28 | Discharge: 2012-03-28 | Disposition: A | Discharge status: Home / Self Care | Payer: 59 | Attending: Emergency Medicine | Admitting: Emergency Medicine

## 2012-03-28 ENCOUNTER — Encounter (HOSPITAL_COMMUNITY): Payer: Self-pay | Admitting: *Deleted

## 2012-03-28 ENCOUNTER — Emergency Department (HOSPITAL_COMMUNITY): Payer: 59

## 2012-03-28 DIAGNOSIS — I10 Essential (primary) hypertension: Secondary | ICD-10-CM | POA: Insufficient documentation

## 2012-03-28 DIAGNOSIS — S0083XA Contusion of other part of head, initial encounter: Secondary | ICD-10-CM

## 2012-03-28 DIAGNOSIS — S63501A Unspecified sprain of right wrist, initial encounter: Secondary | ICD-10-CM

## 2012-03-28 DIAGNOSIS — Y93K1 Activity, walking an animal: Secondary | ICD-10-CM | POA: Insufficient documentation

## 2012-03-28 DIAGNOSIS — Z79899 Other long term (current) drug therapy: Secondary | ICD-10-CM | POA: Insufficient documentation

## 2012-03-28 DIAGNOSIS — IMO0002 Reserved for concepts with insufficient information to code with codable children: Secondary | ICD-10-CM | POA: Insufficient documentation

## 2012-03-28 DIAGNOSIS — J4489 Other specified chronic obstructive pulmonary disease: Secondary | ICD-10-CM | POA: Insufficient documentation

## 2012-03-28 DIAGNOSIS — J449 Chronic obstructive pulmonary disease, unspecified: Secondary | ICD-10-CM | POA: Insufficient documentation

## 2012-03-28 DIAGNOSIS — W010XXA Fall on same level from slipping, tripping and stumbling without subsequent striking against object, initial encounter: Secondary | ICD-10-CM | POA: Insufficient documentation

## 2012-03-28 DIAGNOSIS — S0081XA Abrasion of other part of head, initial encounter: Secondary | ICD-10-CM

## 2012-03-28 DIAGNOSIS — M25539 Pain in unspecified wrist: Secondary | ICD-10-CM | POA: Insufficient documentation

## 2012-03-28 DIAGNOSIS — S0003XA Contusion of scalp, initial encounter: Secondary | ICD-10-CM | POA: Insufficient documentation

## 2012-03-28 DIAGNOSIS — S63509A Unspecified sprain of unspecified wrist, initial encounter: Secondary | ICD-10-CM | POA: Insufficient documentation

## 2012-03-28 DIAGNOSIS — R51 Headache: Secondary | ICD-10-CM | POA: Insufficient documentation

## 2012-03-28 DIAGNOSIS — S80212A Abrasion, left knee, initial encounter: Secondary | ICD-10-CM

## 2012-03-28 MED ORDER — HYDROCODONE-ACETAMINOPHEN 5-325 MG PO TABS: 1.0000 | ORAL_TABLET | Freq: Four times a day (QID) | ORAL | Status: AC | PRN

## 2012-03-28 MED ORDER — HYDROCODONE-ACETAMINOPHEN 5-325 MG PO TABS
1.0000 | ORAL_TABLET | Freq: Once | ORAL | Status: DC
  Filled 2012-03-28: qty 1

## 2012-03-28 NOTE — Discharge Instructions (Signed)
Abrasions Abrasions are skin scrapes. Their treatment depends on how large and deep the abrasion is. Abrasions do not extend through all layers of the skin. A cut or lesion through all skin layers is called a laceration. HOME CARE INSTRUCTIONS   If you were given a dressing, change it at least once a day or as instructed by your caregiver. If the bandage sticks, soak it off with a solution of water or hydrogen peroxide.   Twice a day, wash the area with soap and water to remove all the cream/ointment. You may do this in a sink, under a tub faucet, or in a shower. Rinse off the soap and pat dry with a clean towel. Look for signs of infection (see below).   Reapply cream/ointment according to your caregiver's instruction. This will help prevent infection and keep the bandage from sticking. Telfa or gauze over the wound and under the dressing or wrap will also help keep the bandage from sticking.   If the bandage becomes wet, dirty, or develops a foul smell, change it as soon as possible.   Only take over-the-counter or prescription medicines for pain, discomfort, or fever as directed by your caregiver.  SEEK IMMEDIATE MEDICAL CARE IF:   Increasing pain in the wound.   Signs of infection develop: redness, swelling, surrounding area is tender to touch, or pus coming from the wound.   You have a fever.   Any foul smell coming from the wound or dressing.  Most skin wounds heal within ten days. Facial wounds heal faster. However, an infection may occur despite proper treatment. You should have the wound checked for signs of infection within 24 to 48 hours or sooner if problems arise. If you were not given a wound-check appointment, look closely at the wound yourself on the second day for early signs of infection listed above. MAKE SURE YOU:   Understand these instructions.   Will watch your condition.   Will get help right away if you are not doing well or get worse.  Document Released:  08/28/2005 Document Revised: 11/07/2011 Document Reviewed: 10/22/2011 [employer] Patient Information 2012 [employer].wrist Head Injury, Adult You have had a head injury that does not appear serious at this time. A concussion is a state of changed mental ability, usually from a blow to the head. You should take clear liquids for the rest of the day and then resume your regular diet. You should not take sedatives or alcoholic beverages for as long as directed by your caregiver after discharge. After injuries such as yours, most problems occur within the first 24 hours. SYMPTOMS These minor symptoms may be experienced after discharge:  Memory difficulties.   Dizziness.   Headaches.   Double vision.   Hearing difficulties.   Depression.   Tiredness.   Weakness.   Difficulty with concentration.  If you experience any of these problems, you should not be alarmed. A concussion requires a few days for recovery. Many patients with head injuries frequently experience such symptoms. Usually, these problems disappear without medical care. If symptoms last for more than one day, notify your caregiver. See your caregiver sooner if symptoms are becoming worse rather than better. HOME CARE INSTRUCTIONS   During the next 24 hours you must stay with someone who can watch you for the warning signs listed below.  Although it is unlikely that serious side effects will occur, you should be aware of signs and symptoms which may necessitate your return to this location. Side  effects may occur up to 7 - 10 days following the injury. It is important for you to carefully monitor your condition and contact your caregiver or seek immediate medical attention if there is a change in your condition. SEEK IMMEDIATE MEDICAL CARE IF:   There is confusion or drowsiness.   You can not awaken the injured person.   There is nausea (feeling sick to your stomach) or continued, forceful vomiting.   You notice  dizziness or unsteadiness which is getting worse, or inability to walk.   You have convulsions or unconsciousness.   You experience severe, persistent headaches not relieved by over-the-counter or prescription medicines for pain. (Do not take aspirin as this impairs clotting abilities). Take other pain medications only as directed.   You can not use arms or legs normally.   There is clear or bloody discharge from the nose or ears.  MAKE SURE YOU:   Understand these instructions.   Will watch your condition.   Will get help right away if you are not doing well or get worse.  Document Released: 11/18/2005 Document Revised: 11/07/2011 Document Reviewed: 10/06/2009 [facility] Patient Information 2012 [place], [place].Wrist Splint A wrist splint holds your wrist in a set position so that it does not move (fixed position). It can help broken bones and sprains heal faster, with less pain. It can also help relieve pressure on the nerve that runs down the middle of your arm (median nerve) into your fingers.  HOME CARE  Wear your splint as told by your doctor. It may be worn while you sleep.   Exercise your wrist as told by your doctor. These exercises help keep muscle strength in your hand and wrist. They also help to make sure you keep motion in your fingers.  GET HELP RIGHT AWAY IF:   You start to lose feeling in your hand or fingers.   Your skin or fingernails turn blue or gray, or they feel cold.  MAKE SURE YOU:   Understand these instructions.   Will watch your condition.   Will get help right away if you are not doing well or get worse.  Document Released: 05/06/2008 Document Revised: 11/07/2011 Document Reviewed: 07/29/2011 [facility] Patient Information 2012 [place], [place].Cryotherapy Cryotherapy means treatment with cold. Ice or gel packs can be used to reduce both pain and swelling. Ice is the most helpful within the first 24 to 48 hours after an injury or flareup from overusing a  muscle or joint. Sprains, strains, spasms, burning pain, shooting pain, and aches can all be eased with ice. Ice can also be used when recovering from surgery. Ice is effective, has very few side effects, and is safe for most people to use. PRECAUTIONS  Ice is not a safe treatment option for people with:  Raynaud's phenomenon. This is a condition affecting small blood vessels in the extremities. Exposure to cold may cause your problems to return.   Cold hypersensitivity. There are many forms of cold hypersensitivity, including:   Cold urticaria. Red, itchy hives appear on the skin when the tissues begin to warm after being iced.   Cold erythema. This is a red, itchy rash caused by exposure to cold.   Cold hemoglobinuria. Red blood cells break down when the tissues begin to warm after being iced. The hemoglobin that carry oxygen are passed into the urine because they cannot combine with blood proteins fast enough.   Numbness or altered sensitivity in the area being iced.  If you have any  of the following conditions, do not use ice until you have discussed cryotherapy with your caregiver:  Heart conditions, such as arrhythmia, angina, or chronic heart disease.   High blood pressure.   Healing wounds or open skin in the area being iced.   Current infections.   Rheumatoid arthritis.   Poor circulation.   Diabetes.  Ice slows the blood flow in the region it is applied. This is beneficial when trying to stop inflamed tissues from spreading irritating chemicals to surrounding tissues. However, if you expose your skin to cold temperatures for too long or without the proper protection, you can damage your skin or nerves. Watch for signs of skin damage due to cold. HOME CARE INSTRUCTIONS Follow these tips to use ice and cold packs safely.  Place a dry or damp towel between the ice and skin. A damp towel will cool the skin more quickly, so you may need to shorten the time that the ice is used.    For a more rapid response, add gentle compression to the ice.   Ice for no more than 10 to 20 minutes at a time. The bonier the area you are icing, the less time it will take to get the benefits of ice.   Check your skin after 5 minutes to make sure there are no signs of a poor response to cold or skin damage.   Rest 20 minutes or more in between uses.   Once your skin is numb, you can end your treatment. You can test numbness by very lightly touching your skin. The touch should be so light that you do not see the skin dimple from the pressure of your fingertip. When using ice, most people will feel these normal sensations in this order: cold, burning, aching, and numbness.   Do not use ice on someone who cannot communicate their responses to pain, such as small children or people with dementia.  HOW TO MAKE AN ICE PACK Ice packs are the most common way to use ice therapy. Other methods include ice massage, ice baths, and cryo-sprays. Muscle creams that cause a cold, tingly feeling do not offer the same benefits that ice offers and should not be used as a substitute unless recommended by your caregiver. To make an ice pack, do one of the following:  Place crushed ice or a bag of frozen vegetables in a sealable plastic bag. Squeeze out the excess air. Place this bag inside another plastic bag. Slide the bag into a pillowcase or place a damp towel between your skin and the bag.   Mix 3 parts water with 1 part rubbing alcohol. Freeze the mixture in a sealable plastic bag. When you remove the mixture from the freezer, it will be slushy. Squeeze out the excess air. Place this bag inside another plastic bag. Slide the bag into a pillowcase or place a damp towel between your skin and the bag.  SEEK MEDICAL CARE IF:  You develop white spots on your skin. This may give the skin a blotchy (mottled) appearance.   Your skin turns blue or pale.   Your skin becomes waxy or hard.   Your swelling gets  worse.  MAKE SURE YOU:   Understand these instructions.   Will watch your condition.   Will get help right away if you are not doing well or get worse.  Document Released: 07/15/2011 Document Revised: 11/07/2011 Document Reviewed: 07/15/2011 [facility] Patient Information 2012 [place], [place].Contusion A contusion is a deep bruise.  Contusions are the result of an injury that caused bleeding under the skin. The contusion may turn blue, purple, or yellow. Minor injuries will give you a painless contusion, but more severe contusions may stay painful and swollen for a few weeks.  CAUSES  A contusion is usually caused by a blow, trauma, or direct force to an area of the body. SYMPTOMS   Swelling and redness of the injured area.   Bruising of the injured area.   Tenderness and soreness of the injured area.   Pain.  DIAGNOSIS  The diagnosis can be made by taking a history and physical exam. An X-ray, CT scan, or MRI may be needed to determine if there were any associated injuries, such as fractures. TREATMENT  Specific treatment will depend on what area of the body was injured. In general, the best treatment for a contusion is resting, icing, elevating, and applying cold compresses to the injured area. Over-the-counter medicines may also be recommended for pain control. Ask your caregiver what the best treatment is for your contusion. HOME CARE INSTRUCTIONS   Put ice on the injured area.   Put ice in a plastic bag.   Place a towel between your skin and the bag.   Leave the ice on for 15 to 20 minutes, 3 to 4 times a day.   Only take over-the-counter or prescription medicines for pain, discomfort, or fever as directed by your caregiver. Your caregiver may recommend avoiding anti-inflammatory medicines (aspirin, ibuprofen, and naproxen) for 48 hours because these medicines may increase bruising.   Rest the injured area.   If possible, elevate the injured area to reduce swelling.  SEEK  IMMEDIATE MEDICAL CARE IF:   You have increased bruising or swelling.   You have pain that is getting worse.   Your swelling or pain is not relieved with medicines.  MAKE SURE YOU:   Understand these instructions.   Will watch your condition.   Will get help right away if you are not doing well or get worse.  Document Released: 08/28/2005 Document Revised: 11/07/2011 Document Reviewed: 09/23/2011 [facility] Patient Information 2012 [place], [place].   Take the pain medicine as directed.  Apply ice several times daily to areas of soreness.  Wash abrasions twice daily with soap and water.  Return to the ED if any change in level of consciousness, behavior or coordination.

## 2012-03-28 NOTE — ED Notes (Signed)
Reports tripped over dog's leash, falling, landing face first onto pavement.  Denies loc.  C/o pain to nose, right cheek, and forehead.

## 2012-03-28 NOTE — ED Notes (Signed)
Pt refused pain medication. PA [employer] notified.

## 2012-03-28 NOTE — ED Notes (Signed)
Pt a/ox4. Resp even and unlabored. NAD at this time. D/C instructions reviewed with pt. Pt verbalized understanding. Pt ambulated to lobby with steady gate.  

## 2012-03-28 NOTE — ED Provider Notes (Signed)
History     CSN: [id number]  Arrival date & time 03/28/12  1610   First MD Initiated Contact with Patient 03/28/12 1622      Chief Complaint  Patient presents with  . Facial Pain    (Consider location/radiation/quality/duration/timing/severity/associated sxs/prior treatment) HPI Comments: Walking her dog and tripped over the leash.  Struck L knee , R wrist and R face.  No LOC.  No blood thinners.  Ambulates with no difficulty.  Denies neck pain.  The history is provided by the patient. No language interpreter was used.    Past Medical History  Diagnosis Date  . Hypertension     pulmonary  . Atrial septal defect   . COPD (chronic obstructive pulmonary disease)   . Dyspnea   . Kyphoscoliosis     thoracic spine    Past Surgical History  Procedure Date  . Cardiac surgery   . Tonsillectomy     No family history on file.  History  Substance Use Topics  . Smoking status: Former Games developer  . Smokeless tobacco: Not on file  . Alcohol Use: Yes     occasional    OB History    Grav Para Term Preterm Abortions TAB SAB Ect Mult Living                  Review of Systems  Skin:       Face, wrist and knee injuries.  Neurological: Negative for weakness and headaches.  Psychiatric/Behavioral: Negative for confusion.  All other systems reviewed and are negative.    Allergies  Levofloxacin; Moxifloxacin; and Sulfonamide derivatives  Home Medications   Current Outpatient Rx  Name Route Sig Dispense Refill  . ACETAMINOPHEN 500 MG PO TABS Oral Take 500 mg by mouth as needed.      Maximino Greenland 18-103 MCG/ACT IN AERO Inhalation Inhale 2 puffs into the lungs 3 (three) times daily. 1 Inhaler 12  . ALPRAZOLAM 0.5 MG PO TABS Oral Take 0.5 mg by mouth at bedtime as needed.      . ASPIRIN 81 MG PO TABS Oral Take 81 mg by mouth daily.      Marland Kitchen LEVOTHYROXINE SODIUM 50 MCG PO TABS Oral Take 50 mcg by mouth daily.        BP 148/81  Pulse 109  Temp(Src) 97.9 F (36.6  C) (Oral)  Resp 20  Ht 5' 2.5" (1.588 m)  Wt 110 lb (49.896 kg)  BMI 19.80 kg/m2  SpO2 97%  Physical Exam  Nursing note and vitals reviewed. Constitutional: She is oriented to person, place, and time. She appears well-developed and well-nourished. No distress.  HENT:  Head: Normocephalic. Head is with abrasion. Head is without raccoon's eyes and without Battle's sign.    Right Ear: External ear normal.  Left Ear: External ear normal.  Eyes: EOM are normal. Pupils are equal, round, and reactive to light. Right eye exhibits normal extraocular motion and no nystagmus. Left eye exhibits normal extraocular motion and no nystagmus.  Neck: Trachea normal and normal range of motion. No spinous process tenderness and no muscular tenderness present. No rigidity. Normal range of motion present.  Cardiovascular: Normal rate, regular rhythm and normal heart sounds.   Pulmonary/Chest: Effort normal and breath sounds normal.  Abdominal: Soft. She exhibits no distension. There is no tenderness.  Musculoskeletal:       Right wrist: She exhibits decreased range of motion, tenderness and bony tenderness. She exhibits no swelling, no crepitus, no deformity and no laceration.  Left knee: She exhibits normal range of motion, no swelling, no effusion, no ecchymosis, no deformity, no laceration and no erythema. no tenderness found.       Pain and PT to R distal ulna and radius.  She also has a L knee abrasion but min PT and FROM without pain.  Weight bearing not painful.  Neurological: She is alert and oriented to person, place, and time.  Skin: Skin is warm and dry.  Psychiatric: She has a normal mood and affect. Judgment normal.    ED Course  Procedures (including critical care time)  Labs Reviewed - No data to display Dg Wrist Complete Right  03/28/2012  *RADIOLOGY REPORT*  Clinical Data: Wrist pain  RIGHT WRIST - COMPLETE 3+ VIEW  Comparison: None  Findings:  There is no evidence of fracture or  dislocation.  There is no evidence of arthropathy or other focal bone abnormality.  Soft tissues are unremarkable.  IMPRESSION:  Negative examination.  Original Report Authenticated By: Rosealee Albee, M.D.   Ct Orbitss W/o Cm  03/28/2012  *RADIOLOGY REPORT*  Clinical Data: Cheek, forehead and nasal pain following a fall.  CT ORBITS WITHOUT CONTRAST  Technique:  Multidetector CT imaging of the orbits was performed following the standard protocol without intravenous contrast.  Comparison: None.  Findings: Normal appearing orbits and orbital contents.  No fractures seen.  The nasal bone and anterior maxillary spine are intact.  Small bifrontal scalp hematomas.  Deviation of the midportion of the nasal septum to the left.  Minimal, physiological mucosal thickening in both maxillary sinuses.  Very tiny amount of right maxillary sinus fluid.  IMPRESSION:  1.  No fracture. 2.  Minimal right maxillary acute sinusitis or blood.  Original Report Authenticated By: Darrol Angel, M.D.     1. Facial contusion   2. Right wrist sprain   3. Facial abrasion   4. Abrasion of left knee       MDM  Doubt IC injury.  xrays nl.  Pt told to return to ED if change in LOC, behavior or coordination.        Worthy Rancher, PA 03/28/12 1843

## 2012-03-28 NOTE — ED Provider Notes (Signed)
Medical screening examination/treatment/procedure(s) were performed by non-physician practitioner and as supervising physician I was immediately available for consultation/collaboration.  Nicoletta Dress. Colon Branch, MD 03/28/12 2349

## 2012-04-08 ENCOUNTER — Ambulatory Visit (INDEPENDENT_AMBULATORY_CARE_PROVIDER_SITE_OTHER): Payer: 59 | Admitting: Cardiology

## 2012-04-08 ENCOUNTER — Encounter: Payer: Self-pay | Admitting: Cardiology

## 2012-04-08 VITALS — BP 112/78 | HR 76 | Ht 62.5 in | Wt 111.0 lb

## 2012-04-08 DIAGNOSIS — Q211 Atrial septal defect: Secondary | ICD-10-CM

## 2012-04-08 DIAGNOSIS — R000 Tachycardia, unspecified: Secondary | ICD-10-CM

## 2012-04-08 NOTE — Assessment & Plan Note (Signed)
This is significantly improved with surgery. She is only on 12.5  mg a day. We'll discontinue and see how she does.

## 2012-04-08 NOTE — Progress Notes (Signed)
HPI [name] [name] returns today for evaluation of her history of atrial septal defect, status post repair. She also had resting tachycardia prior to the procedure several years ago. She had some pulmonary hypertension and chronic lung disease.  She's done remarkably well and is currently totally asymptomatic. She denies any palpitations, chest pain, any significant dyspnea on exertion or edema. He would really like to come off of her low dose beta blocker. She cut it back to 12-1/2 mg on her own a couple months ago.  Past Medical History  Diagnosis Date  . Hypertension     pulmonary  . Atrial septal defect   . COPD (chronic obstructive pulmonary disease)   . Dyspnea   . Kyphoscoliosis     thoracic spine    Current Outpatient Prescriptions  Medication Sig Dispense Refill  . acetaminophen (TYLENOL) 500 MG tablet Take 500 mg by mouth as needed.        . ALPRAZolam (XANAX) 0.5 MG tablet Take 0.5 mg by mouth at bedtime as needed.        Marland Kitchen aspirin 81 MG tablet Take 81 mg by mouth daily.        Marland Kitchen levothyroxine (SYNTHROID, LEVOTHROID) 50 MCG tablet Take 50 mcg by mouth daily.        Marland Kitchen albuterol-ipratropium (COMBIVENT) 18-103 MCG/ACT inhaler Inhale 2 puffs into the lungs 3 (three) times daily.  1 Inhaler  12  . HYDROcodone-acetaminophen (NORCO) 5-325 MG per tablet Take 1 tablet by mouth every 6 (six) hours as needed for pain.  20 tablet  0    Allergies  Allergen Reactions  . Levofloxacin   . Moxifloxacin   . Sulfonamide Derivatives     No family history on file.  History   Social History  . Marital Status: Married    Spouse Name: N/A    Number of Children: N/A  . Years of Education: N/A   Occupational History  . Not on file.   Social History Main Topics  . Smoking status: Former Games developer  . Smokeless tobacco: Not on file  . Alcohol Use: Yes     occasional  . Drug Use: No  . Sexually Active: Not on file   Other Topics Concern  . Not on file   Social History Narrative  . No  narrative on file    ROS ALL NEGATIVE EXCEPT THOSE NOTED IN HPI  PE  General Appearance: well developed, well nourished in no acute distress HEENT: symmetrical face, PERRLA, good dentition  Neck: no JVD, thyromegaly, or adenopathy, trachea midline Chest: symmetric without deformity Cardiac: PMI non-displaced, RRR, normal S1, S2, no gallop or murmur Lung: clear to ausculation and percussion Vascular: all pulses full without bruits  Abdominal: nondistended, nontender, good bowel sounds, no HSM, no bruits Extremities: no cyanosis, clubbing or edema, no sign of DVT, no varicosities  Skin: normal color, no rashes Neuro: alert and oriented x 3, non-focal Pysch: normal affect  EKG  BMET    Component Value Date/Time   NA 136 09/02/2010 1224   K 3.9 09/02/2010 1224   CL 103 09/02/2010 1224   CO2 24 09/02/2010 1224   GLUCOSE 94 09/02/2010 1224   BUN 9 09/02/2010 1224   CREATININE 0.62 09/02/2010 1224   CALCIUM 8.6 09/02/2010 1224   GFRNONAA >60 09/02/2010 1224   GFRAA  Value: >60        The eGFR has been calculated using the MDRD equation. This calculation has not been validated in all clinical situations.  eGFR's persistently <60 mL/min signify possible Chronic Kidney Disease. 09/02/2010 1224    Lipid Panel     Component Value Date/Time   CHOL  Value: 114        ATP III CLASSIFICATION:  <200     mg/dL   Desirable  161-096  mg/dL   Borderline High  >=045    mg/dL   High        40/08/8118 0455   TRIG 54 11/09/2009 0455   HDL 38* 11/09/2009 0455   CHOLHDL 3.0 11/09/2009 0455   VLDL 11 11/09/2009 0455   LDLCALC  Value: 65        Total Cholesterol/HDL:CHD Risk Coronary Heart Disease Risk Table                     Men   Women  1/2 Average Risk   3.4   3.3  Average Risk       5.0   4.4  2 X Average Risk   9.6   7.1  3 X Average Risk  23.4   11.0        Use the calculated Patient Ratio above and the CHD Risk Table to determine the patient's CHD Risk.        ATP III CLASSIFICATION (LDL):  <100     mg/dL    Optimal  147-829  mg/dL   Near or Above                    Optimal  130-159  mg/dL   Borderline  562-130  mg/dL   High  >865     mg/dL   Very High 78/03/6961 9528    CBC    Component Value Date/Time   WBC 9.5 09/02/2010 1224   RBC 4.76 09/02/2010 1224   HGB 15.0 09/02/2010 1224   HCT 44.0 09/02/2010 1224   PLT 165 09/02/2010 1224   MCV 92.5 09/02/2010 1224   MCH 31.5 09/02/2010 1224   MCHC 34.1 09/02/2010 1224   RDW 12.6 09/02/2010 1224   LYMPHSABS 0.9 09/02/2010 1224   MONOABS 1.0 09/02/2010 1224   EOSABS 0.0 09/02/2010 1224   BASOSABS 0.0 09/02/2010 1224

## 2012-04-08 NOTE — Assessment & Plan Note (Signed)
She has done well status post repair several years ago. I will see her back in 2 years.

## 2012-04-08 NOTE — Patient Instructions (Signed)
Your physician has recommended you make the following change in your medication: Stop Metoprolol  Your physician wants you to follow-up in: 1 year. You will receive a reminder letter in the mail two months in advance. If you don't receive a letter, please call our office to schedule the follow-up appointment.

## 2012-09-29 ENCOUNTER — Encounter: Payer: Self-pay | Admitting: Cardiology

## 2012-10-15 ENCOUNTER — Ambulatory Visit: Payer: 59 | Admitting: Cardiology

## 2012-11-21 ENCOUNTER — Encounter (HOSPITAL_COMMUNITY): Payer: Self-pay | Admitting: *Deleted

## 2012-11-21 ENCOUNTER — Emergency Department (HOSPITAL_COMMUNITY)
Admission: EM | Admit: 2012-11-21 | Discharge: 2012-11-21 | Disposition: A | Discharge status: Home / Self Care | Payer: 59 | Attending: Emergency Medicine | Admitting: Emergency Medicine

## 2012-11-21 ENCOUNTER — Emergency Department (HOSPITAL_COMMUNITY): Payer: 59

## 2012-11-21 DIAGNOSIS — M4120 Other idiopathic scoliosis, site unspecified: Secondary | ICD-10-CM | POA: Insufficient documentation

## 2012-11-21 DIAGNOSIS — Z79899 Other long term (current) drug therapy: Secondary | ICD-10-CM | POA: Insufficient documentation

## 2012-11-21 DIAGNOSIS — I10 Essential (primary) hypertension: Secondary | ICD-10-CM | POA: Insufficient documentation

## 2012-11-21 DIAGNOSIS — J40 Bronchitis, not specified as acute or chronic: Secondary | ICD-10-CM | POA: Insufficient documentation

## 2012-11-21 DIAGNOSIS — R059 Cough, unspecified: Secondary | ICD-10-CM | POA: Insufficient documentation

## 2012-11-21 DIAGNOSIS — R05 Cough: Secondary | ICD-10-CM | POA: Insufficient documentation

## 2012-11-21 DIAGNOSIS — Q211 Atrial septal defect: Secondary | ICD-10-CM | POA: Insufficient documentation

## 2012-11-21 DIAGNOSIS — Z87891 Personal history of nicotine dependence: Secondary | ICD-10-CM | POA: Insufficient documentation

## 2012-11-21 DIAGNOSIS — Z7982 Long term (current) use of aspirin: Secondary | ICD-10-CM | POA: Insufficient documentation

## 2012-11-21 DIAGNOSIS — J3489 Other specified disorders of nose and nasal sinuses: Secondary | ICD-10-CM | POA: Insufficient documentation

## 2012-11-21 DIAGNOSIS — J4489 Other specified chronic obstructive pulmonary disease: Secondary | ICD-10-CM | POA: Insufficient documentation

## 2012-11-21 DIAGNOSIS — Q2111 Secundum atrial septal defect: Secondary | ICD-10-CM | POA: Insufficient documentation

## 2012-11-21 DIAGNOSIS — R6883 Chills (without fever): Secondary | ICD-10-CM | POA: Insufficient documentation

## 2012-11-21 DIAGNOSIS — J449 Chronic obstructive pulmonary disease, unspecified: Secondary | ICD-10-CM | POA: Insufficient documentation

## 2012-11-21 LAB — CBC WITH DIFFERENTIAL/PLATELET
Basophils Absolute: 0 10*3/uL (ref 0.0–0.1)
Basophils Relative: 1 % (ref 0–1)
Hemoglobin: 13.8 g/dL (ref 12.0–15.0)
MCHC: 33.5 g/dL (ref 30.0–36.0)
Neutro Abs: 2.6 10*3/uL (ref 1.7–7.7)
Neutrophils Relative %: 64 % (ref 43–77)
RDW: 13.4 % (ref 11.5–15.5)
WBC: 4 10*3/uL (ref 4.0–10.5)

## 2012-11-21 LAB — BASIC METABOLIC PANEL
Chloride: 101 mEq/L (ref 96–112)
GFR calc Af Amer: >90 mL/min (ref >90)
Potassium: 3.6 mEq/L (ref 3.5–5.1)

## 2012-11-21 LAB — TROPONIN I: Troponin I: <0.3 ng/mL (ref <0.30)

## 2012-11-21 LAB — D-DIMER, QUANTITATIVE: D-Dimer, Quant: 0.28 ug/mL-FEU (ref 0.00–0.48)

## 2012-11-21 MED ORDER — ALBUTEROL SULFATE (5 MG/ML) 0.5% IN NEBU
2.5000 mg | INHALATION_SOLUTION | Freq: Once | RESPIRATORY_TRACT | Status: AC
  Administered 2012-11-21: 2.5 mg via RESPIRATORY_TRACT
  Filled 2012-11-21: qty 0.5

## 2012-11-21 MED ORDER — PREDNISONE 50 MG PO TABS: ORAL_TABLET | ORAL | Status: DC

## 2012-11-21 MED ORDER — ALBUTEROL SULFATE HFA 108 (90 BASE) MCG/ACT IN AERS: 2.0000 | INHALATION_SPRAY | RESPIRATORY_TRACT | Status: DC | PRN

## 2012-11-21 MED ORDER — DOXYCYCLINE HYCLATE 100 MG PO CAPS: 100.0000 mg | ORAL_CAPSULE | Freq: Two times a day (BID) | ORAL | Status: DC

## 2012-11-21 MED ORDER — PREDNISONE 50 MG PO TABS
60.0000 mg | ORAL_TABLET | Freq: Once | ORAL | Status: DC
  Filled 2012-11-21: qty 1

## 2012-11-21 MED ORDER — METHYLPREDNISOLONE SODIUM SUCC 125 MG IJ SOLR
INTRAMUSCULAR | Status: AC
  Administered 2012-11-21: 125 mg via INTRAMUSCULAR
  Filled 2012-11-21: qty 2

## 2012-11-21 MED ORDER — IPRATROPIUM BROMIDE 0.02 % IN SOLN
0.5000 mg | Freq: Once | RESPIRATORY_TRACT | Status: AC
  Administered 2012-11-21: 0.5 mg via RESPIRATORY_TRACT
  Filled 2012-11-21: qty 2.5

## 2012-11-21 NOTE — ED Notes (Signed)
Pt's RA sats while ambulating was 98-100%

## 2012-11-21 NOTE — ED Notes (Signed)
Pt with productive cough and wheezing since yesterday, denies fever or n/v/d, used inhaler just PTA

## 2012-11-21 NOTE — ED Notes (Signed)
MD at bedside. 

## 2012-11-21 NOTE — ED Provider Notes (Signed)
History   This chart was scribed for Glynn Octave, MD by Leone Payor, ED Scribe. This patient was seen in room APA17/APA17 and the patient's care was started at 2043.   CSN: [id number]  Arrival date & time 11/21/12  2022   First MD Initiated Contact with Patient 11/21/12 2043      Chief Complaint  Patient presents with  . Shortness of Breath  . Nasal Congestion  . Wheezing  . Cough  . Back Pain     The history is provided by the patient. No language interpreter was used.    TAEKO SCHAFFER is a 61 y.o. female with h/o COPD who presents to the Emergency Department complaining of a new, constant, gradually worsening SOB with associated congestion, chills, wheezing, and cough productive of green and yellow mucous starting 1 day ago. Pt states she used an albuterol inhaler about 1 hour ago with mild relief. She takes thyroid medication and Xanax at home but denies taking any blood thinners. Pt has not had the flu shot. Pt also has runny nose, sore throat but denies vomiting, abdominal pain, pain or swelling in legs.   Pt has h/o HTN, atrial septal defect, dyspnea, cardiac surgery.  Pt is a former smoker and occasionally uses alcohol. Past Medical History  Diagnosis Date  . Hypertension     pulmonary  . Atrial septal defect   . COPD (chronic obstructive pulmonary disease)   . Dyspnea   . Kyphoscoliosis     thoracic spine    Past Surgical History  Procedure Date  . Cardiac surgery   . Tonsillectomy     No family history on file.  History  Substance Use Topics  . Smoking status: Former Games developer  . Smokeless tobacco: Not on file  . Alcohol Use: Yes     Comment: occasional    No OB history provided.   Review of Systems  A complete 10 system review of systems was obtained and all systems are negative except as noted in the HPI and PMH.    Allergies  Levofloxacin; Sulfonamide derivatives; and Moxifloxacin  Home Medications   Current Outpatient Rx  Name  Route   Sig  Dispense  Refill  . IPRATROPIUM-ALBUTEROL 18-103 MCG/ACT IN AERO   Inhalation   Inhale 2 puffs into the lungs 3 (three) times daily.   1 Inhaler   12   . ALPRAZOLAM 0.5 MG PO TABS   Oral   Take 0.5 mg by mouth at bedtime as needed. sleep         . ASPIRIN EC 81 MG PO TBEC   Oral   Take 81 mg by mouth daily.         Marland Kitchen LEVOTHYROXINE SODIUM 50 MCG PO TABS   Oral   Take 50 mcg by mouth daily.             BP 158/81  Pulse 117  Temp 99 F (37.2 C)  Resp 20  Ht 5\' 3"  (1.6 m)  Wt 110 lb (49.896 kg)  BMI 19.49 kg/m2  SpO2 94%  Physical Exam  Nursing note and vitals reviewed. Constitutional: She appears well-developed and well-nourished.       Speaking in short sentences.   HENT:  Head: Normocephalic and atraumatic.       Oropharynx is clear  Eyes: Conjunctivae normal are normal. Pupils are equal, round, and reactive to light.  Neck: Neck supple. No tracheal deviation present. No thyromegaly present.  No meningismus.   Cardiovascular: Normal rate and regular rhythm.   No murmur heard. Pulmonary/Chest: Effort normal and breath sounds normal.       Diffuse wheezing.    Abdominal: Soft. Bowel sounds are normal. She exhibits no distension. There is no tenderness.  Musculoskeletal: Normal range of motion. She exhibits no edema and no tenderness.       No leg swelling  Neurological: She is alert. Coordination normal.  Skin: Skin is warm and dry. No rash noted.  Psychiatric: She has a normal mood and affect.    ED Course  Procedures (including critical care time)  DIAGNOSTIC STUDIES: Oxygen Saturation is 95% on room air, adequate by my interpretation.    COORDINATION OF CARE:   8:59 PM Discussed treatment plan which includes breathing treatment with pt at bedside and pt agreed to plan.   Labs Reviewed  CBC WITH DIFFERENTIAL - Abnormal; Notable for the following:    Lymphs Abs 0.6 (*)     Monocytes Relative 17 (*)     All other components within  normal limits  BASIC METABOLIC PANEL - Abnormal; Notable for the following:    Glucose, Bld 112 (*)     All other components within normal limits  TROPONIN I  D-DIMER, QUANTITATIVE   Dg Chest 2 View  11/21/2012  *RADIOLOGY REPORT*  Clinical Data: Cough, congestion and shortness of breath  CHEST - 2 VIEW  Comparison: 12/26/2011  Findings: There is a prominent dextroscoliosis of the thoracic spine.  The cardiac silhouette is normal in size and overall configuration.  No mediastinal or hilar masses or adenopathy.  The lungs are hyperexpanded with thickened bronchovascular markings. There are no infiltrates and there is no evidence of pulmonary edema.  No pneumothorax or pleural effusion.  IMPRESSION: No acute cardiopulmonary disease.  Stable appearance from the prior study.   Original Report Authenticated By: Amie Portland, M.D.      No diagnosis found.    MDM  2 days of cough, congestion, difficulty breathing, wheezing and shortness of breath. Denies chest pain, fever or chills. Diffuse wheezing on exam. Denies history of COPD but is documented in her chart. She is a former smoker.  Chest x-ray negative. Lung sounds improved after nebulizer and steroids. Troponin and d-dimer negative. Patient denies any chest pain or shortness of breath. We'll treat for bronchitis with nebs, steroids, antibiotics given her smoking history. Ambulatory in the ED without desaturation.   Date: 11/21/2012  Rate: 114  Rhythm: sinus tachycardia  QRS Axis: normal  Intervals: normal  ST/T Wave abnormalities: nonspecific ST/T changes  Conduction Disutrbances:none  Narrative Interpretation:anterior lateral T wave inversions appear to be new.  Old EKG Reviewed: changes noted    I personally performed the services described in this documentation, which was scribed in my presence. The recorded information has been reviewed and is accurate.    Glynn Octave, MD 11/22/12 [id number]

## 2012-11-21 NOTE — ED Notes (Addendum)
Pt cough, congestion, chills, wheezing, and sob x 1 day. Pt used an inhaler around 7:30pm

## 2012-11-21 NOTE — ED Notes (Signed)
Patient transported to X-ray 

## 2012-12-17 ENCOUNTER — Ambulatory Visit (INDEPENDENT_AMBULATORY_CARE_PROVIDER_SITE_OTHER): Payer: 59 | Admitting: Cardiology

## 2012-12-17 ENCOUNTER — Encounter: Payer: Self-pay | Admitting: Cardiology

## 2012-12-17 VITALS — BP 110/64 | HR 83 | Ht 63.0 in | Wt 112.0 lb

## 2012-12-17 DIAGNOSIS — Q2111 Secundum atrial septal defect: Secondary | ICD-10-CM

## 2012-12-17 DIAGNOSIS — Q211 Atrial septal defect: Secondary | ICD-10-CM

## 2012-12-17 DIAGNOSIS — J4489 Other specified chronic obstructive pulmonary disease: Secondary | ICD-10-CM

## 2012-12-17 DIAGNOSIS — J449 Chronic obstructive pulmonary disease, unspecified: Secondary | ICD-10-CM

## 2012-12-17 DIAGNOSIS — I2789 Other specified pulmonary heart diseases: Secondary | ICD-10-CM

## 2012-12-17 DIAGNOSIS — R0989 Other specified symptoms and signs involving the circulatory and respiratory systems: Secondary | ICD-10-CM

## 2012-12-17 MED ORDER — METOPROLOL TARTRATE 25 MG PO TABS: 12.5000 mg | ORAL_TABLET | Freq: Every morning | ORAL | Status: DC

## 2012-12-17 NOTE — Patient Instructions (Addendum)
Your physician recommends that you continue on your current medications as directed. Please refer to the Current Medication list given to you today.   Your physician wants you to follow-up in: 1 year with Dr. Wall. You will receive a reminder letter in the mail two months in advance. If you don't receive a letter, please call our office to schedule the follow-up appointment.  

## 2012-12-17 NOTE — Progress Notes (Signed)
HPI [name] [name] returns today for followup of her atrial septal defect repair, history pulmonary hypertension, COPD, history of preoperative edema and tachycardia. The latter 2 problems disappeared shortly after surgery.  She continues to do remarkably well. She is only taking 12-1/2 mg of Lopressor. She would like to come off of it.  She denies orthopnea, PND, palpitations, or edema. She's taking much better care of herself than  prior to  surgery. She continues not to smoke.  Past Medical History  Diagnosis Date  . Hypertension     pulmonary  . Atrial septal defect   . COPD (chronic obstructive pulmonary disease)   . Dyspnea   . Kyphoscoliosis     thoracic spine    Current Outpatient Prescriptions  Medication Sig Dispense Refill  . aspirin EC 81 MG tablet Take 81 mg by mouth as needed.       Marland Kitchen levothyroxine (SYNTHROID, LEVOTHROID) 50 MCG tablet Take 50 mcg by mouth daily.        . metoprolol tartrate (LOPRESSOR) 25 MG tablet Take 0.5 tablets (12.5 mg total) by mouth every morning.  90 tablet  3    Allergies  Allergen Reactions  . Levofloxacin Shortness Of Breath  . Sulfonamide Derivatives Shortness Of Breath  . Moxifloxacin Other (See Comments)    hallucinations    No family history on file.  History   Social History  . Marital Status: Married    Spouse Name: N/A    Number of Children: N/A  . Years of Education: N/A   Occupational History  . Not on file.   Social History Main Topics  . Smoking status: Former Games developer  . Smokeless tobacco: Not on file  . Alcohol Use: Yes     Comment: occasional  . Drug Use: No  . Sexually Active: Not on file   Other Topics Concern  . Not on file   Social History Narrative  . No narrative on file    ROS ALL NEGATIVE EXCEPT THOSE NOTED IN HPI  PE  General Appearance: well developed, well nourished in no acute distress HEENT: symmetrical face, PERRLA, good dentition  Neck: no JVD, thyromegaly, or adenopathy, trachea  midline Chest: symmetric without deformity Cardiac: PMI non-displaced, RRR, normal S1, S2, no gallop or murmur, no right-sided lift Lung: clear to ausculation and percussion Vascular: all pulses full without bruits  Abdominal: nondistended, nontender, good bowel sounds, no HSM, no bruits Extremities: no cyanosis, clubbing or edema, no sign of DVT, no varicosities  Skin: normal color, no rashes Neuro: alert and oriented x 3, non-focal Pysch: normal affect  EKG  BMET    Component Value Date/Time   NA 138 11/21/2012 2059   K 3.6 11/21/2012 2059   CL 101 11/21/2012 2059   CO2 27 11/21/2012 2059   GLUCOSE 112* 11/21/2012 2059   BUN 10 11/21/2012 2059   CREATININE 0.68 11/21/2012 2059   CALCIUM 9.7 11/21/2012 2059   GFRNONAA >90 11/21/2012 2059   GFRAA >90 11/21/2012 2059    Lipid Panel     Component Value Date/Time   CHOL  Value: 114        ATP III CLASSIFICATION:  <200     mg/dL   Desirable  161-096  mg/dL   Borderline High  >=045    mg/dL   High        40/08/8118 0455   TRIG 54 11/09/2009 0455   HDL 38* 11/09/2009 0455   CHOLHDL 3.0 11/09/2009 0455   VLDL 11  11/09/2009 0455   LDLCALC  Value: 65        Total Cholesterol/HDL:CHD Risk Coronary Heart Disease Risk Table                     Men   Women  1/2 Average Risk   3.4   3.3  Average Risk       5.0   4.4  2 X Average Risk   9.6   7.1  3 X Average Risk  23.4   11.0        Use the calculated Patient Ratio above and the CHD Risk Table to determine the patient's CHD Risk.        ATP III CLASSIFICATION (LDL):  <100     mg/dL   Optimal  161-096  mg/dL   Near or Above                    Optimal  130-159  mg/dL   Borderline  045-409  mg/dL   High  >811     mg/dL   Very High 91/03/7828 5621    CBC    Component Value Date/Time   WBC 4.0 11/21/2012 2059   RBC 4.75 11/21/2012 2059   HGB 13.8 11/21/2012 2059   HCT 41.2 11/21/2012 2059   PLT 184 11/21/2012 2059   MCV 86.7 11/21/2012 2059   MCH 29.1 11/21/2012 2059   MCHC 33.5 11/21/2012  2059   RDW 13.4 11/21/2012 2059   LYMPHSABS 0.6* 11/21/2012 2059   MONOABS 0.7 11/21/2012 2059   EOSABS 0.1 11/21/2012 2059   BASOSABS 0.0 11/21/2012 2059

## 2012-12-17 NOTE — Assessment & Plan Note (Signed)
Doing well status post repair. Tachycardia and edema resolved. She  will try to come off the low-dose Lopressor. If she has recurrent palpitations or tachycardia she'll start back. I'll see her in the office in one year.

## 2013-12-22 ENCOUNTER — Encounter (INDEPENDENT_AMBULATORY_CARE_PROVIDER_SITE_OTHER): Payer: Self-pay

## 2013-12-22 ENCOUNTER — Encounter: Payer: Self-pay | Admitting: Cardiology

## 2013-12-22 ENCOUNTER — Ambulatory Visit (INDEPENDENT_AMBULATORY_CARE_PROVIDER_SITE_OTHER): Payer: 59 | Admitting: Cardiology

## 2013-12-22 VITALS — BP 118/62 | HR 67 | Ht 63.0 in | Wt 115.0 lb

## 2013-12-22 DIAGNOSIS — Q211 Atrial septal defect: Secondary | ICD-10-CM

## 2013-12-22 DIAGNOSIS — E785 Hyperlipidemia, unspecified: Secondary | ICD-10-CM

## 2013-12-22 DIAGNOSIS — R079 Chest pain, unspecified: Secondary | ICD-10-CM

## 2013-12-22 DIAGNOSIS — Q2111 Secundum atrial septal defect: Secondary | ICD-10-CM

## 2013-12-22 NOTE — Progress Notes (Signed)
Patient ID: [name], female   DOB: 01-04-1951, 63 y.o.   MRN: [id number]    Patient Name: [name] [name] Date of Encounter: 12/22/2013  Primary Care Provider:  Alice Reichert, MD Primary Cardiologist:  Tobias Alexander, H (prior Dr Daleen Squibb patient)  Problem List   Past Medical History  Diagnosis Date  . Hypertension     pulmonary  . Atrial septal defect   . COPD (chronic obstructive pulmonary disease)   . Dyspnea   . Kyphoscoliosis     thoracic spine   Past Surgical History  Procedure Laterality Date  . Cardiac surgery    . Tonsillectomy      Allergies  Allergies  Allergen Reactions  . Levofloxacin Shortness Of Breath  . Sulfonamide Derivatives Shortness Of Breath  . Moxifloxacin Other (See Comments)    hallucinations    HPI  Mrs Rottmann returns today for followup of her atrial septal defect repair for a large ASD repaired surgically in 2010. 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 She reports improvement of DOE after surgery. She states that in November she had an episode of retrosternal chest pain that appeared after an argument with her husband. It was associated with significant SOB. She states that she experiences occasional SOB on moderate exertion, no syncope or palpitations, PND, or edema. She's taking much better care of herself than  prior to  surgery. She continues not to smoke.  Home Medications  Prior to Admission medications   Medication Sig Start Date End Date Taking? Authorizing Provider  ALPRAZolam Prudy Feeler) 0.5 MG tablet Take 0.5 mg by mouth at bedtime as needed for anxiety.   Yes Historical Provider, MD  aspirin EC 81 MG tablet Take 81 mg by mouth as needed.    Yes Historical Provider, MD  metoprolol tartrate (LOPRESSOR) 25 MG tablet Take 0.5 tablets (12.5 mg total) by mouth every morning. 12/17/12  Yes Gaylord Shih, MD  VENTOLIN HFA 108 (90 BASE) MCG/ACT inhaler as directed. 11/16/13  Yes Historical Provider, MD     Family History  No family history on file.  Social History  History   Social History  . Marital Status: Married    Spouse Name: N/A    Number of Children: N/A  . Years of Education: N/A   Occupational History  . Not on file.   Social History Main Topics  . Smoking status: Former Games developer  . Smokeless tobacco: Not on file  . Alcohol Use: Yes     Comment: occasional  . Drug Use: No  . Sexual Activity: Not on file   Other Topics Concern  . Not on file   Social History Narrative  . No narrative on file     Review of Systems, as per HPI, otherwise negative General:  No chills, fever, night sweats or weight changes.  Cardiovascular:  No chest pain, dyspnea on exertion, edema, orthopnea, palpitations, paroxysmal nocturnal dyspnea. Dermatological: No rash, lesions/masses Respiratory: No cough, dyspnea Urologic: No hematuria, dysuria Abdominal:   No nausea, vomiting, diarrhea, bright red blood per rectum, melena, or hematemesis Neurologic:  No visual changes, wkns, changes in mental status. All other systems reviewed and are otherwise negative except as noted above.  Physical Exam  Blood pressure 118/62, pulse 67, height 5\' 3"  (1.6 m), weight 115 lb (52.164 kg).  General: Pleasant, NAD Psych: Normal affect. Neuro: Alert and oriented X 3. Moves all extremities spontaneously. HEENT: Normal  Neck: Supple without bruits or JVD.  Lungs:  Resp regular and unlabored, CTA. Heart: RRR no s3, s4, or murmurs. Abdomen: Soft, non-tender, non-distended, BS + x 4.  Extremities: No clubbing, cyanosis or edema. DP/PT/Radials 2+ and equal bilaterally.  Labs:  No results found for this basename: CKTOTAL, CKMB, TROPONINI,  in the last 72 hours Lab Results  Component Value Date   WBC 4.0 11/21/2012   HGB 13.8 11/21/2012   HCT 41.2 11/21/2012   MCV 86.7 11/21/2012   PLT 184 11/21/2012   No results found for this basename: NA, K, CL, CO2, BUN, CREATININE, CALCIUM, LABALBU, PROT,  BILITOT, ALKPHOS, ALT, AST, GLUCOSE,  in the last 168 hours Lab Results  Component Value Date   CHOL  Value: 114        ATP III CLASSIFICATION:  <200     mg/dL   Desirable  161-096200-239  mg/dL   Borderline High  >=045>=240    mg/dL   High        40/9/811912/08/2009   HDL 38* 11/09/2009   LDLCALC  Value: 65        Total Cholesterol/HDL:CHD Risk Coronary Heart Disease Risk Table                     Men   Women  1/2 Average Risk   3.4   3.3  Average Risk       5.0   4.4  2 X Average Risk   9.6   7.1  3 X Average Risk  23.4   11.0        Use the calculated Patient Ratio above and the CHD Risk Table to determine the patient's CHD Risk.        ATP III CLASSIFICATION (LDL):  <100     mg/dL   Optimal  147-829100-129  mg/dL   Near or Above                    Optimal  130-159  mg/dL   Borderline  562-130160-189  mg/dL   High  >865>190     mg/dL   Very High 78/4/696212/08/2009   TRIG 54 11/09/2009   Lab Results  Component Value Date   DDIMER 0.28 11/21/2012   No components found with this basename: POCBNP,   Accessory Clinical Findings  Echocardiogram - 11/10/2009  - Left ventricle: The cavity size was mildly reduced. Wall thickness was normal. Systolic function was normal. The estimated ejection fraction was in the range of 55% to 60%. The interventricular septum was D-shaped in systole and diastole, suggesting RV pressure and volume overload. - Aortic valve: There was no stenosis. - Mitral valve: Mild regurgitation. - Left atrium: The atrium was moderately dilated. No evidence of thrombus in the atrial cavity or appendage. - Pulmonary veins: 4 pulmonary veins (2 left, 2 right) visualized. They all appeared to drain normally into the left atrium. - Right ventricle: The cavity size was severely dilated. Systolic function was mildly reduced. - Right atrium: The atrium was moderately dilated. - Atrial septum: There was a large 40 mm+ secundum atrial septal defect with primarily left to right flow. - Pericardium, extracardiac: A trivial  pericardial effusion was identified. Impressions:  - The RV was severely dilated with mild systolic dysfunction. The LV was small and compressed by the RV. EF 55-60%. There was a large secundum ASD (40 mm+). The pulmonary veins appeared to drain normally to the left atrium. The ASD is too large for percutaneous closure.  Echocardiogram - 10/02/2010  Left ventricle: Systolic function was normal. The estimated ejection fraction was in the range of 55% to 60%. Aortic valve: Doppler: There was no stenosis. Mitral valve: There is flattened coaptation of the mitral valve leaflets without true prolapse. Mildly thickened leaflets . Doppler: Moderate regurgitation. Peak gradient: 3mm Hg (D). Atrial septum: No evidence of atrial shunting by color doppler. Right ventricle: The cavity size was normal. Wall thickness was normal. Systolic function was normal. Pulmonic valve: Doppler: Mild regurgitation. Tricuspid valve: Doppler: Mild regurgitation. Right atrium: The atrium was normal in size. Pericardium: There was no pericardial effusion. Systemic veins: Inferior vena cava: The vessel was normal in size; the respirophasic diameter changes were in the normal range (= 50%); findings are consistent with normal central venous pressure.  ECG - NSR 67 BPM, normal ECG   Assessment & Plan  1.  H/O ASD repair, RV dilatation and dysfunction ? Pulmonary hypertension, we will repeat echocardiogram as there is none in the last 3 years.  2. Chest pain - normal ECG, we will order a treadmill stress test to evaluate for ischemia. She is insisting on discontinuation of metoprolol, we will follow HR increase with exercise. If abnormal stress test we will restart.  3. Lipids, not checked in a while, we will order today with CMP.  If all the results are normal, follow up in 1 year.   Tobias Alexander, Rexene Edison, MD, [facility] 12/22/2013, 11:59 AM

## 2013-12-22 NOTE — Patient Instructions (Signed)
Your physician has recommended you make the following change in your medication: you may stop taking Metoprolol  Your physician has requested that you have an exercise tolerance test. For further information please visit [email or web address]. Please also follow instruction sheet, as given.  Your physician has requested that you have an echocardiogram. Echocardiography is a painless test that uses sound waves to create images of your heart. It provides your doctor with information about the size and shape of your heart and how well your heart's chambers and valves are working. This procedure takes approximately one hour. There are no restrictions for this procedure.  Your physician recommends that you return for lab work in: today  Your physician wants you to follow-up in: 1 year. You will receive a reminder letter in the mail two months in advance. If you don't receive a letter, please call our office to schedule the follow-up appointment.

## 2014-01-11 ENCOUNTER — Ambulatory Visit (HOSPITAL_COMMUNITY)
Admission: RE | Admit: 2014-01-11 | Discharge: 2014-01-11 | Disposition: A | Discharge status: Home / Self Care | Payer: 59 | Source: Ambulatory Visit | Attending: Cardiology | Admitting: Cardiology

## 2014-01-11 ENCOUNTER — Encounter (HOSPITAL_COMMUNITY): Payer: 59

## 2014-01-11 DIAGNOSIS — J449 Chronic obstructive pulmonary disease, unspecified: Secondary | ICD-10-CM | POA: Insufficient documentation

## 2014-01-11 DIAGNOSIS — Q211 Atrial septal defect: Secondary | ICD-10-CM

## 2014-01-11 DIAGNOSIS — R0609 Other forms of dyspnea: Secondary | ICD-10-CM | POA: Insufficient documentation

## 2014-01-11 DIAGNOSIS — Q2111 Secundum atrial septal defect: Secondary | ICD-10-CM

## 2014-01-11 DIAGNOSIS — I059 Rheumatic mitral valve disease, unspecified: Secondary | ICD-10-CM

## 2014-01-11 DIAGNOSIS — E785 Hyperlipidemia, unspecified: Secondary | ICD-10-CM | POA: Insufficient documentation

## 2014-01-11 DIAGNOSIS — R0989 Other specified symptoms and signs involving the circulatory and respiratory systems: Secondary | ICD-10-CM | POA: Insufficient documentation

## 2014-01-11 DIAGNOSIS — J4489 Other specified chronic obstructive pulmonary disease: Secondary | ICD-10-CM | POA: Insufficient documentation

## 2014-01-11 NOTE — Progress Notes (Signed)
*  PRELIMINARY RESULTS* Echocardiogram 2D Echocardiogram has been performed.  [name] [name] 01/11/2014, 3:14 PM

## 2014-01-12 ENCOUNTER — Encounter: Payer: Self-pay | Admitting: Cardiology

## 2014-01-12 ENCOUNTER — Ambulatory Visit (HOSPITAL_COMMUNITY)
Admission: RE | Admit: 2014-01-12 | Discharge: 2014-01-12 | Disposition: A | Discharge status: Home / Self Care | Payer: 59 | Source: Ambulatory Visit | Attending: Cardiology | Admitting: Cardiology

## 2014-01-12 DIAGNOSIS — R079 Chest pain, unspecified: Secondary | ICD-10-CM

## 2014-01-12 NOTE — Progress Notes (Signed)
[name] [name], This patient had a negative stress test. She should continue taking Lopressor 12.5 mg po bid (in our sustem it says that she only takes it in the mornings, if that's the case she needs to take it BID) Thank you, Aris LotKatarina

## 2014-01-12 NOTE — Progress Notes (Signed)
Stress Lab Nurses Notes - [name]   [name]  01/12/2014  Reason for doing test: Chest Pain  Type of test: Regular GTX  Nurse performing test: Marlena ClipperNancy Bast, RN   MD performing test: Melayah Skorupski/Michelle Geni BersLenze  Family MD: [facility]  Test explained and consent signed: Yes  IV started: No IV started  Symptoms: Legs tired and Pain  Treatment/Intervention: None  Reason test stopped: Fatigue and protocol completed  After recovery IV was: No IV  Patient to return to Nuc. Med at : NA  Patient discharged: Home  Patient's Condition upon discharge was: Stable  Comments: Patient walked 4.45 minutes. Her resting HR was 98 and 129/89 and exercise HR peak was 139 and peak BP 146/93. Symptoms of leg pain and fatigue resolved in recovery. Patient had taken her Metoprolol this am.  Angelica PouBast, Nancy L  Attending note:  Patient exercised on Bruce protocol for 4:45 achieving maximum workload 7 METS, peak heart rate 141 BPM which was 88% MPHR (she took Lopressor 12.5 mg on AM of test). Peak blood pressure 146/93. No chest pain reported. No diagnostic ST changes, rare PVCs. Low risk Duke treadmill score of 4.5.  Jonelle SidleSamuel G. Cecely Rengel, M.D., F.A.C.C.

## 2014-01-12 NOTE — Progress Notes (Addendum)
Stress Lab Nurses Notes - [name] [name]  [name] [name] 01/12/2014  Reason for doing test: Chest Pain  Type of test: Regular GTX  Nurse performing test: Marlena ClipperNancy Sigmond Patalano, RN  Nuclear Medicine Tech: Not Applicable  Echo Tech: Not Applicable  MD performing test: McDowell/Michelle Geni BersLenze  Family [facility]: [facility]  Test explained and consent signed: yes  IV started: No IV started  Symptoms: Legs tired and Pain  Treatment/Intervention: None  Reason test stopped: fatigue and protocol completed  After recovery IV was: No IV  Patient to return to Nuc. Med at : NA  Patient discharged: Home  Patient's Condition upon discharge was: stable  Comments: Patient walked  4.45 minutes. Her resting HR was 98 and 129/89 and exercise HR peak was 139 and peak BP 146/93. Symptoms of leg pain and fatigue resolved in recovery. Patient had taken her Metoprolol this am.   [name] [name], [name] [name]

## 2014-01-14 ENCOUNTER — Telehealth: Payer: Self-pay

## 2014-01-14 ENCOUNTER — Telehealth: Payer: Self-pay | Admitting: Cardiology

## 2014-01-14 NOTE — Telephone Encounter (Signed)
New Problem:  Pt is requesting a call back on [place].

## 2014-01-14 NOTE — Telephone Encounter (Signed)
**Note De-Identified Era Parr Obfuscation** The pt called back and wants to know if there is a medication that she can take once daily instead of twice daily. Please advise on medication and dosage.  Larita FifeLynn,  This patient had a negative stress test. She should continue taking Lopressor 12.5 mg po bid (in our sustem it says that she only takes it in the mornings, if that's the case she needs to take it BID)  Thank you,  Aris LotKatarina

## 2014-01-14 NOTE — Telephone Encounter (Signed)
Message copied by Demetrios LollVIA, PATRICIA M on Fri Jan 14, 2014  4:34 PM ------      Message from: Lars MassonNELSON, KATARINA H      Created: Wed Jan 12, 2014  2:07 PM                   ----- Message -----         From: Jonelle SidleSamuel G McDowell, MD         Sent: 01/12/2014   1:21 PM           To: Lars MassonKatarina H Nelson, MD             ------

## 2014-01-14 NOTE — Telephone Encounter (Signed)
**Note De-Identified Ezell Poke Obfuscation** The pt is advised. She is concerned with recommendation to increase her Metoprolol dose to BID as she was advised at last OV to stop taking it. She wants to know why. Please advise.   Larita FifeLynn,  This patient had a negative stress test. She should continue taking Lopressor 12.5 mg po bid (in our sustem it says that she only takes it in the mornings, if that's the case she needs to take it BID)  Thank you,  Aris LotKatarina

## 2014-01-14 NOTE — Telephone Encounter (Signed)
**Note De-Identified Hilma Steinhilber Obfuscation** The pt states that she is only going to take 1/2 tablet once daily and that she will discuss with Dr Delton SeeNelson at her next OV.

## 2014-01-14 NOTE — Telephone Encounter (Signed)
Message copied by Demetrios LollVIA, PATRICIA M on Fri Jan 14, 2014  2:33 PM ------      Message from: Lars MassonNELSON, KATARINA H      Created: Wed Jan 12, 2014  2:07 PM                   ----- Message -----         From: Jonelle SidleSamuel G McDowell, MD         Sent: 01/12/2014   1:21 PM           To: Lars MassonKatarina H Nelson, MD             ------

## 2014-01-14 NOTE — Telephone Encounter (Signed)
I never told her to stop it, she was insisting on stopping it, based on stress test result, I think she should continue taking it. Metoprolol is always taken as BID. If she wants to take it once daily we should switch to toprol XL 12.5 mg po daily

## 2014-01-17 NOTE — Telephone Encounter (Signed)
Yes, she can take toprol XL 12.5 mg po daily. [name] [name]

## 2014-01-17 NOTE — Telephone Encounter (Signed)
Spoke to patient she stated she did not want to take toprol ,she will continue lopressor 12.5 mg twice a day.

## 2014-01-19 ENCOUNTER — Other Ambulatory Visit: Payer: Self-pay | Admitting: Cardiology

## 2014-01-21 ENCOUNTER — Other Ambulatory Visit: Payer: Self-pay

## 2014-01-21 MED ORDER — METOPROLOL TARTRATE 12.5 MG HALF TABLET: 12.5000 mg | ORAL_TABLET | Freq: Two times a day (BID) | ORAL | Status: DC

## 2014-01-21 NOTE — Telephone Encounter (Signed)
Per your recent note she is taking this bid. Ok to refill for bid since office note has qd? Please advise. Thanks, MI

## 2014-01-21 NOTE — Telephone Encounter (Signed)
**Note De-identified Roby Spalla Obfuscation** Medication phoned in.

## 2014-02-25 ENCOUNTER — Emergency Department (HOSPITAL_COMMUNITY)
Admission: EM | Admit: 2014-02-25 | Discharge: 2014-02-26 | Disposition: A | Discharge status: Home / Self Care | Payer: 59 | Attending: Emergency Medicine | Admitting: Emergency Medicine

## 2014-02-25 ENCOUNTER — Encounter (HOSPITAL_COMMUNITY): Payer: Self-pay | Admitting: Emergency Medicine

## 2014-02-25 ENCOUNTER — Emergency Department (HOSPITAL_COMMUNITY): Payer: 59

## 2014-02-25 DIAGNOSIS — I10 Essential (primary) hypertension: Secondary | ICD-10-CM | POA: Insufficient documentation

## 2014-02-25 DIAGNOSIS — Z880 Allergy status to penicillin: Secondary | ICD-10-CM | POA: Insufficient documentation

## 2014-02-25 DIAGNOSIS — Z8739 Personal history of other diseases of the musculoskeletal system and connective tissue: Secondary | ICD-10-CM | POA: Insufficient documentation

## 2014-02-25 DIAGNOSIS — Z79899 Other long term (current) drug therapy: Secondary | ICD-10-CM | POA: Insufficient documentation

## 2014-02-25 DIAGNOSIS — Z87891 Personal history of nicotine dependence: Secondary | ICD-10-CM | POA: Insufficient documentation

## 2014-02-25 DIAGNOSIS — J441 Chronic obstructive pulmonary disease with (acute) exacerbation: Secondary | ICD-10-CM | POA: Insufficient documentation

## 2014-02-25 MED ORDER — ALBUTEROL SULFATE (2.5 MG/3ML) 0.083% IN NEBU
2.5000 mg | INHALATION_SOLUTION | Freq: Once | RESPIRATORY_TRACT | Status: AC
  Administered 2014-02-25: 2.5 mg via RESPIRATORY_TRACT
  Filled 2014-02-25: qty 3

## 2014-02-25 MED ORDER — DEXAMETHASONE SODIUM PHOSPHATE 4 MG/ML IJ SOLN
8.0000 mg | Freq: Once | INTRAMUSCULAR | Status: AC
  Administered 2014-02-25: 8 mg via INTRAMUSCULAR
  Filled 2014-02-25: qty 2

## 2014-02-25 MED ORDER — IPRATROPIUM-ALBUTEROL 0.5-2.5 (3) MG/3ML IN SOLN
3.0000 mL | Freq: Once | RESPIRATORY_TRACT | Status: AC
  Administered 2014-02-25: 3 mL via RESPIRATORY_TRACT
  Filled 2014-02-25: qty 3

## 2014-02-25 MED ORDER — DEXAMETHASONE SODIUM PHOSPHATE 4 MG/ML IJ SOLN: 8.0000 mg | Freq: Once | INTRAMUSCULAR | Status: DC

## 2014-02-25 NOTE — ED Notes (Signed)
Cough, sob, onset Wednesday, clear sputum

## 2014-02-26 LAB — BASIC METABOLIC PANEL
BUN: 11 mg/dL (ref 6–23)
CHLORIDE: 101 meq/L (ref 96–112)
CO2: 30 mEq/L (ref 19–32)
Calcium: 10.1 mg/dL (ref 8.4–10.5)
Creatinine, Ser: 0.66 mg/dL (ref 0.50–1.10)
GFR calc non Af Amer: >90 mL/min (ref >90)
Glucose, Bld: 130 mg/dL — ABNORMAL HIGH (ref 70–99)
POTASSIUM: 3.7 meq/L (ref 3.7–5.3)
Sodium: 144 mEq/L (ref 137–147)

## 2014-02-26 LAB — CBC WITH DIFFERENTIAL/PLATELET
BASOS PCT: 0 % (ref 0–1)
Basophils Absolute: 0 10*3/uL (ref 0.0–0.1)
Eosinophils Absolute: 0.6 10*3/uL (ref 0.0–0.7)
Eosinophils Relative: 8 % — ABNORMAL HIGH (ref 0–5)
HCT: 40.6 % (ref 36.0–46.0)
HEMOGLOBIN: 13 g/dL (ref 12.0–15.0)
Lymphocytes Relative: 20 % (ref 12–46)
Lymphs Abs: 1.5 10*3/uL (ref 0.7–4.0)
MCH: 28.5 pg (ref 26.0–34.0)
MCHC: 32 g/dL (ref 30.0–36.0)
MCV: 89 fL (ref 78.0–100.0)
Monocytes Absolute: 0.8 10*3/uL (ref 0.1–1.0)
Monocytes Relative: 10 % (ref 3–12)
NEUTROS ABS: 4.5 10*3/uL (ref 1.7–7.7)
NEUTROS PCT: 61 % (ref 43–77)
Platelets: 187 10*3/uL (ref 150–400)
RBC: 4.56 MIL/uL (ref 3.87–5.11)
RDW: 13.2 % (ref 11.5–15.5)
WBC: 7.4 10*3/uL (ref 4.0–10.5)

## 2014-02-26 LAB — TROPONIN I

## 2014-02-26 MED ORDER — AZITHROMYCIN 200 MG/5ML PO SUSR: 260.0000 mg | Freq: Every day | ORAL | Status: AC

## 2014-02-26 MED ORDER — LEVALBUTEROL HCL 1.25 MG/0.5ML IN NEBU
1.2500 mg | INHALATION_SOLUTION | Freq: Once | RESPIRATORY_TRACT | Status: AC
  Administered 2014-02-26: 1.25 mg via RESPIRATORY_TRACT
  Filled 2014-02-26: qty 0.5

## 2014-02-26 MED ORDER — AZITHROMYCIN 200 MG/5ML PO SUSR
500.0000 mg | Freq: Once | ORAL | Status: AC
  Administered 2014-02-26: 500 mg via ORAL
  Filled 2014-02-26: qty 15

## 2014-02-26 MED ORDER — PREDNISOLONE SODIUM PHOSPHATE 15 MG/5ML PO SOLN: 60.0000 mg | Freq: Every day | ORAL | Status: AC

## 2014-02-26 NOTE — Discharge Instructions (Signed)
Chronic Obstructive Pulmonary Disease Exacerbation Chronic obstructive pulmonary disease (COPD) is a common lung condition in which airflow from the lungs is limited. COPD is a general term that can be used to describe many different lung problems that limit airflow, including chronic bronchitis and emphysema. COPD exacerbations are episodes when breathing symptoms become much worse and require extra treatment. Without treatment, COPD exacerbations can be life threatening, and frequent COPD exacerbations can cause further damage to your lungs. CAUSES   Respiratory infections.   Exposure to smoke.   Exposure to air pollution, chemical fumes, or dust. Sometimes there is no apparent cause or trigger. RISK FACTORS  Smoking cigarettes.  Older age.  Frequent prior COPD exacerbations. SIGNS AND SYMPTOMS   Increased coughing.   Increased thick spit (sputum) production.   Increased wheezing.   Increased shortness of breath.   Rapid breathing.   Chest tightness. DIAGNOSIS  Your medical history, a physical exam, and tests will help your health care provider make a diagnosis. Tests may include:  A chest X-ray.  Basic lab tests.  Sputum testing.  An arterial blood gas test. TREATMENT  Depending on the severity of your COPD exacerbation, you may need to be admitted to a hospital for treatment. Some of the treatments commonly used to treat COPD exacerbations are:   Antibiotic medicines.   Bronchodilators. These are drugs that expand the air passages. They may be given with an inhaler or nebulizer. Spacer devices may be needed to help improve drug delivery.  Corticosteroid medicines.  Supplemental oxygen therapy.  HOME CARE INSTRUCTIONS   Do not smoke. Quitting smoking is very important to prevent COPD from getting worse and exacerbations from happening as often.  Avoid exposure to all substances that irritate the airway, especially to tobacco smoke.   If prescribed,  take your antibiotics as directed. Finish them even if you start to feel better.  Only take over-the-counter or prescription medicines as directed by your health care provider.It is important to use correct technique with inhaled medicines.  Drink enough fluids to keep your urine clear or pale yellow (unless you have a medical condition that requires fluid restriction).  Use a cool mist vaporizer. This makes it easier to clear your chest when you cough.   If you have a home nebulizer and oxygen, continue to use them as directed.   Maintain all necessary vaccinations to prevent infections.   Exercise regularly.   Eat a healthy diet.   Keep all follow-up appointments as directed by your health care provider. SEEK IMMEDIATE MEDICAL CARE IF:  You have worsening shortness of breath.   You have trouble talking.   You have severe chest pain.  You have blood in your sputum.  You have a fever.  You have weakness, vomit repeatedly, or faint.   You feel confused.   You continue to get worse. MAKE SURE YOU:   Understand these instructions.  Will watch your condition.  Will get help right away if you are not doing well or get worse. Document Released: 09/15/2007 Document Revised: 09/08/2013 Document Reviewed: 07/23/2013 [facility] [employer] Patient Information 2014 [place] [place].  Take your next dose of Zithromax tomorrow evening along with your next dose of the prednisone (orapred is the liquid version of prednisone).  Continue using your albuterol nebulizer every 4 hours.  Rest, makes you drinking plenty of fluids.  Return here over the weekend for any worsened or new symptoms.  Otherwise, plan to be rechecked by your physician early next week.

## 2014-02-26 NOTE — ED Provider Notes (Signed)
CSN: [id number]     Arrival date & time 02/25/14  2242 History   First MD Initiated Contact with Patient 02/25/14 2312     Chief Complaint  Patient presents with  . Shortness of Breath     (Consider location/radiation/quality/duration/timing/severity/associated sxs/prior Treatment) HPI Comments: [name] [name] is a 63 y.o. Female with a history of COPD presenting with increased cough productive of clear sputum, shortness of breath and wheezing for the past 2 days.  She has taken her home nebulizer albuterol with transient relief of symptoms, her most recent dose was early this evening.  She denies weakness or dizziness, fevers or chills.  She also denies chest pain but reports soreness bilaterally along her anterior lower rib cage from coughing.  She denies orthopnea, peripheral edema abdominal pain, nausea, vomiting or diaphoresis.       The history is provided by the patient.    Past Medical History  Diagnosis Date  . Hypertension     pulmonary  . Atrial septal defect   . COPD (chronic obstructive pulmonary disease)   . Dyspnea   . Kyphoscoliosis     thoracic spine   Past Surgical History  Procedure Laterality Date  . Tonsillectomy    . Cardiac surgery     History reviewed. No pertinent family history. History  Substance Use Topics  . Smoking status: Former Games developermoker  . Smokeless tobacco: Not on file  . Alcohol Use: Yes     Comment: occasional   OB History   Grav Para Term Preterm Abortions TAB SAB Ect Mult Living                 Review of Systems  Constitutional: Negative for fever.  HENT: Negative for congestion and sore throat.   Eyes: Negative.   Respiratory: Positive for cough, chest tightness, shortness of breath and wheezing.   Cardiovascular: Negative for palpitations and leg swelling.  Gastrointestinal: Negative for nausea and abdominal pain.  Genitourinary: Negative.   Musculoskeletal: Negative for arthralgias, joint swelling and neck pain.  Skin:  Negative.  Negative for rash and wound.  Neurological: Negative for dizziness, weakness, light-headedness, numbness and headaches.  Psychiatric/Behavioral: Negative.       Allergies  Levofloxacin; Sulfonamide derivatives; and Moxifloxacin  Home Medications   Current Outpatient Rx  Name  Route  Sig  Dispense  Refill  . ALPRAZolam (XANAX) 0.5 MG tablet   Oral   Take 0.5 mg by mouth at bedtime as needed for anxiety.         Marland Kitchen. aspirin EC 81 MG tablet   Oral   Take 81 mg by mouth as needed.          . metoprolol tartrate (LOPRESSOR) 12.5 mg TABS tablet   Oral   Take 0.5 tablets (12.5 mg total) by mouth 2 (two) times daily.   30 tablet   6   . metoprolol tartrate (LOPRESSOR) 25 MG tablet   Oral   Take 0.5 tablets (12.5 mg total) by mouth 2 (two) times daily.   30 tablet   6   . VENTOLIN HFA 108 (90 BASE) MCG/ACT inhaler      as directed.          BP 141/69  Pulse 104  Temp(Src) 97.8 F (36.6 C) (Oral)  Resp 24  SpO2 92% Physical Exam  Nursing note and vitals reviewed. Constitutional: She appears well-developed and well-nourished.  HENT:  Head: Normocephalic and atraumatic.  Eyes: Conjunctivae are normal.  Neck: Normal  range of motion.  Cardiovascular: Normal rate, regular rhythm, normal heart sounds and intact distal pulses.   Pulmonary/Chest: Effort normal. No respiratory distress. She has decreased breath sounds. She has no wheezes. She has no rhonchi. She has no rales.  Decreased breath sounds throughout all lung fields.  No rhonchi, no wheeze, but with increased expiratory phase.  Abdominal: Soft. Bowel sounds are normal. There is no tenderness.  Musculoskeletal: Normal range of motion. She exhibits no edema and no tenderness.  Neurological: She is alert.  Skin: Skin is warm and dry.  Psychiatric: She has a normal mood and affect.    ED Course  Procedures (including critical care time) Labs Review Labs Reviewed  CBC WITH DIFFERENTIAL - Abnormal;  Notable for the following:    Eosinophils Relative 8 (*)    All other components within normal limits  BASIC METABOLIC PANEL - Abnormal; Notable for the following:    Glucose, Bld 130 (*)    All other components within normal limits  TROPONIN I   Imaging Review Dg Chest 2 View  02/26/2014   CLINICAL DATA:  Cough  EXAM: CHEST  2 VIEW  COMPARISON:  11/21/2012  FINDINGS: Chronic interstitial markings/hyperinflation. No focal consolidation. No pleural effusion or pneumothorax.  Heart is top-normal in size.  S-shaped thoracolumbar scoliosis.  IMPRESSION: No evidence of acute cardiopulmonary disease.   Electronically Signed   By: Charline Bills M.D.   On: 02/26/2014 00:28     EKG Interpretation   Date/Time:  Saturday February 26 2014 00:19:12 EDT Ventricular Rate:  111 PR Interval:  190 QRS Duration: 78 QT Interval:  328 QTC Calculation: 446 R Axis:   73 Text Interpretation:  Sinus tachycardia with Premature atrial complexes  Possible Left atrial enlargement Nonspecific T wave abnormality Abnormal  ECG When compared with ECG of 21-Nov-2012 21:28, Premature atrial  complexes are now Present Confirmed by MOLPUS  MD, Jonny Ruiz ([id number]) on  02/26/2014 12:23:50 AM      MDM   Final diagnoses:  COPD exacerbation    Patient was given an albuterol and Atrovent nebulizer and Decadron 8 mg IM after which she felt improved and she had improved aeration upon reexam.  She was ambulated in the department and her pulse ox was stable at 92%.  She did feel short of breath with ambulating.  At rest at reexam her pulse ox continued to fluctuate between 89-92%. should be given an additional breathing treatment with Xopenex as she had concerns about tremors from her first breathing treatment.  We'll anticipate probable the distal home after this treatment.  Patient was discussed with Dr. Read Drivers who will reassess patient after this treatment.      Burgess Amor, PA-C 02/26/14 [phone]

## 2014-02-26 NOTE — ED Provider Notes (Signed)
Medical screening examination/treatment/procedure(s) were conducted as a shared visit with non-physician practitioner(s) and myself.  I personally evaluated the patient during the encounter.  3:04 AM Patient resting comfortably in no distress. No wheezing heard. Diminished sounds in the left base. Oxygen saturation 96% on room air.    Hanley SeamenJohn L Orpha Dain, MD 02/26/14 [phone]

## 2014-08-16 ENCOUNTER — Other Ambulatory Visit (HOSPITAL_COMMUNITY)
Admission: RE | Admit: 2014-08-16 | Discharge: 2014-08-16 | Disposition: A | Discharge status: Home / Self Care | Payer: 59 | Source: Ambulatory Visit | Attending: Obstetrics & Gynecology | Admitting: Obstetrics & Gynecology

## 2014-08-16 ENCOUNTER — Encounter: Payer: Self-pay | Admitting: Obstetrics & Gynecology

## 2014-08-16 ENCOUNTER — Ambulatory Visit (INDEPENDENT_AMBULATORY_CARE_PROVIDER_SITE_OTHER): Payer: 59 | Admitting: Obstetrics & Gynecology

## 2014-08-16 VITALS — BP 120/80 | Ht 62.2 in | Wt 116.0 lb

## 2014-08-16 DIAGNOSIS — Z01419 Encounter for gynecological examination (general) (routine) without abnormal findings: Secondary | ICD-10-CM | POA: Insufficient documentation

## 2014-08-16 DIAGNOSIS — Z1212 Encounter for screening for malignant neoplasm of rectum: Secondary | ICD-10-CM

## 2014-08-16 DIAGNOSIS — Z1151 Encounter for screening for human papillomavirus (HPV): Secondary | ICD-10-CM | POA: Insufficient documentation

## 2014-08-16 DIAGNOSIS — E039 Hypothyroidism, unspecified: Secondary | ICD-10-CM

## 2014-08-16 LAB — HEMOCCULT GUIAC POC 1CARD (OFFICE): Fecal Occult Blood, POC: NEGATIVE

## 2014-08-16 NOTE — Addendum Note (Signed)
Addended by: Criss Alvine on: 08/16/2014 03:50 PM   Modules accepted: Orders

## 2014-08-16 NOTE — Progress Notes (Signed)
Patient ID: [name], female   DOB: 08/15/1951, 63 y.o.   MRN: [id number] Subjective:     [name] [name] is a 63 y.o. female here for a routine exam.  No LMP recorded. Patient is postmenopausal. No obstetric history on file. Birth Control Method:  Postmenopausal Menstrual Calendar(currently): N/A  Current complaints: Uses albuterol every day, denies SOB or CP right now.   Current acute medical issues:  Asthma/COPD, stopped smoking 8 years ago.    Recent Gynecologic History No LMP recorded. Patient is postmenopausal. Last Pap: Doesn't remember; ">5 years ago." ,  normal Last mammogram: About 5 years ago,  normal  Past Medical History  Diagnosis Date  . Hypertension     pulmonary  . Atrial septal defect   . COPD (chronic obstructive pulmonary disease)   . Dyspnea   . Kyphoscoliosis     thoracic spine    Past Surgical History  Procedure Laterality Date  . Tonsillectomy    . Cardiac surgery      OB History   Grav Para Term Preterm Abortions TAB SAB Ect Mult Living                  History   Social History  . Marital Status: Married    Spouse Name: N/A    Number of Children: N/A  . Years of Education: N/A   Social History Main Topics  . Smoking status: Former Games developer  . Smokeless tobacco: None  . Alcohol Use: Yes     Comment: occasional  . Drug Use: No  . Sexual Activity: None   Other Topics Concern  . None   Social History Narrative  . None    Family History  Problem Relation Age of Onset  . Lung cancer Paternal Grandfather   . Breast cancer Paternal Grandmother   . Colon cancer Father   . Stroke Mother   . Hypertension Mother   . Pancreatic cancer Maternal Aunt      Review of Systems  Review of Systems  Constitutional: Negative for fever, chills, weight loss, malaise/fatigue and diaphoresis.  HENT: Negative for hearing loss, ear pain, nosebleeds, congestion, sore throat, neck pain, tinnitus and ear discharge.   Eyes: Negative for blurred  vision, double vision, photophobia, pain, discharge and redness.  Respiratory: Negative for cough, hemoptysis, sputum production, shortness of breath, wheezing and stridor.   Cardiovascular: Negative for chest pain, palpitations, orthopnea, claudication, leg swelling and PND.  Gastrointestinal: negative for abdominal pain. Negative for heartburn, nausea, vomiting, diarrhea, constipation, blood in stool and melena.  Genitourinary: Negative for dysuria, urgency, frequency, hematuria and flank pain.  Musculoskeletal: Negative for myalgias, back pain, joint pain and falls.  Skin: Negative for itching and rash.  Neurological: Negative for dizziness, tingling, tremors, sensory change, speech change, focal weakness, seizures, loss of consciousness, weakness and headaches.  Endo/Heme/Allergies: Negative for environmental allergies and polydipsia. Does not bruise/bleed easily.  Psychiatric/Behavioral: Negative for depression, suicidal ideas, hallucinations, memory loss and substance abuse. The patient is not nervous/anxious and does not have insomnia.        Objective:    Physical Exam  Vitals reviewed. Constitutional: She is oriented to person, place, and time. She appears well-developed and well-nourished.  HENT:  Head: Normocephalic and atraumatic.        Right Ear: External ear normal.  Left Ear: External ear normal.  Nose: Nose normal.  Mouth/Throat: Oropharynx is clear and moist.  Eyes: Conjunctivae and EOM are normal. Pupils are equal, round,  and reactive to light. Right eye exhibits no discharge. Left eye exhibits no discharge. No scleral icterus.  Neck: Normal range of motion. Neck supple. No tracheal deviation present. No thyromegaly present.  Cardiovascular: Normal rate, regular rhythm, normal heart sounds and intact distal pulses.  Exam reveals no gallop and no friction rub.   No murmur heard. Respiratory: Effort normal and breath sounds normal. No respiratory distress. She has no  wheezes. She has no rales. She exhibits no tenderness.  GI: Soft. Bowel sounds are normal. She exhibits no distension and no mass. There is no tenderness. There is no rebound and no guarding.  Genitourinary:  Breasts no masses skin changes or nipple changes bilaterally      Vulva is normal without lesions Vagina is pink moist without discharge Cervix normal in appearance and pap is done Uterus is normal size shape and contour Adnexa is negative with normal sized ovaries  Rectal: hemoccult negative, normal tone, no masses  Musculoskeletal: Normal range of motion. She exhibits no edema and no tenderness.  Neurological: She is alert and oriented to person, place, and time. She has normal reflexes. She displays normal reflexes. No cranial nerve deficit. She exhibits normal muscle tone. Coordination normal.  Skin: Skin is warm and dry. No rash noted. No erythema. No pallor.  Psychiatric: She has a normal mood and affect. Her behavior is normal. Judgment and thought content normal.       Assessment:    Healthy female exam.    Plan:    Education reviewed: low fat, low cholesterol diet, self breast exams and weight bearing exercise.   Needs mammogram.  Await pap smear results.  Hypothyroidism: Check TSH, expect this is abnormal due to not taking synthroid.

## 2014-08-17 LAB — TSH: TSH: 3.854 u[IU]/mL (ref 0.350–4.500)

## 2014-08-18 ENCOUNTER — Telehealth: Payer: Self-pay | Admitting: Obstetrics & Gynecology

## 2014-08-18 MED ORDER — LEVOTHYROXINE SODIUM 50 MCG PO TABS: 50.0000 ug | ORAL_TABLET | Freq: Every day | ORAL | Status: DC

## 2014-08-18 NOTE — Telephone Encounter (Signed)
Pt called back and stated wants Synthroid vs Levothyroxine.

## 2014-08-18 NOTE — Telephone Encounter (Signed)
Pt informed of TSH results from 08/16/2014.   Pt requesting refill on Levothyroxine 50 mcg.

## 2014-08-19 LAB — CYTOLOGY - PAP

## 2014-12-28 ENCOUNTER — Ambulatory Visit (INDEPENDENT_AMBULATORY_CARE_PROVIDER_SITE_OTHER): Payer: Managed Care, Other (non HMO) | Admitting: Cardiology

## 2014-12-28 ENCOUNTER — Encounter: Payer: Self-pay | Admitting: Cardiology

## 2014-12-28 VITALS — BP 118/78 | HR 85 | Ht 62.2 in | Wt 119.0 lb

## 2014-12-28 DIAGNOSIS — R062 Wheezing: Secondary | ICD-10-CM

## 2014-12-28 DIAGNOSIS — Z8774 Personal history of (corrected) congenital malformations of heart and circulatory system: Secondary | ICD-10-CM

## 2014-12-28 DIAGNOSIS — Z9889 Other specified postprocedural states: Secondary | ICD-10-CM

## 2014-12-28 DIAGNOSIS — R0609 Other forms of dyspnea: Secondary | ICD-10-CM

## 2014-12-28 DIAGNOSIS — J209 Acute bronchitis, unspecified: Secondary | ICD-10-CM

## 2014-12-28 LAB — BASIC METABOLIC PANEL
BUN: 14 mg/dL (ref 6–23)
CO2: 29 mEq/L (ref 19–32)
Calcium: 9.9 mg/dL (ref 8.4–10.5)
Chloride: 102 mEq/L (ref 96–112)
Creatinine, Ser: 0.64 mg/dL (ref 0.40–1.20)
GFR: 100.23 mL/min (ref >60.00)
Glucose, Bld: 90 mg/dL (ref 70–99)
Potassium: 4 mEq/L (ref 3.5–5.1)
Sodium: 138 mEq/L (ref 135–145)

## 2014-12-28 LAB — CBC WITH DIFFERENTIAL/PLATELET
Basophils Absolute: 0 10*3/uL (ref 0.0–0.1)
Basophils Relative: 0.4 % (ref 0.0–3.0)
Eosinophils Absolute: 0.5 10*3/uL (ref 0.0–0.7)
Eosinophils Relative: 7.6 % — ABNORMAL HIGH (ref 0.0–5.0)
HCT: 41.3 % (ref 36.0–46.0)
Hemoglobin: 13.7 g/dL (ref 12.0–15.0)
Lymphocytes Relative: 19.7 % (ref 12.0–46.0)
Lymphs Abs: 1.4 10*3/uL (ref 0.7–4.0)
MCHC: 33.2 g/dL (ref 30.0–36.0)
MCV: 86.6 fl (ref 78.0–100.0)
Monocytes Absolute: 0.5 10*3/uL (ref 0.1–1.0)
Monocytes Relative: 6.9 % (ref 3.0–12.0)
Neutro Abs: 4.6 10*3/uL (ref 1.4–7.7)
Neutrophils Relative %: 65.4 % (ref 43.0–77.0)
Platelets: 191 10*3/uL (ref 150.0–400.0)
RBC: 4.77 Mil/uL (ref 3.87–5.11)
RDW: 13.3 % (ref 11.5–15.5)
WBC: 7.1 10*3/uL (ref 4.0–10.5)

## 2014-12-28 MED ORDER — AZITHROMYCIN 100 MG/5ML PO SUSR: 100.0000 mg | Freq: Every day | ORAL | Status: DC

## 2014-12-28 NOTE — Progress Notes (Signed)
Patient ID: [name], female   DOB: 1951-05-09, 64 y.o.   MRN: [id number]    Patient Name: [name] [name] Date of Encounter: 12/28/2014  Primary Care Provider:  Alice Reichert, MD Primary Cardiologist:  Lars Masson (prior Dr Daleen Squibb patient)  Problem List   Past Medical History  Diagnosis Date  . Hypertension     pulmonary  . Atrial septal defect   . COPD (chronic obstructive pulmonary disease)   . Dyspnea   . Kyphoscoliosis     thoracic spine   Past Surgical History  Procedure Laterality Date  . Tonsillectomy    . Cardiac surgery      Allergies  Allergies  Allergen Reactions  . Levofloxacin Shortness Of Breath  . Sulfonamide Derivatives Shortness Of Breath  . Moxifloxacin Other (See Comments)    hallucinations    HPI  [name] [name] returns today for followup of her atrial septal defect repair for a large ASD repaired surgically in 2010. 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 She reports improvement of DOE after surgery. She states that in November she had an episode of retrosternal chest pain that appeared after an argument with her husband. It was associated with significant SOB. She states that she experiences occasional SOB on moderate exertion, no syncope or palpitations, PND, or edema. She's taking much better care of herself than  prior to  surgery. She continues not to smoke.  12/28/2014 - the patient is coming after 1 year, the last year echo showed normal LVEF, normal RV function, mildly dilated, no pulmonary hypertension. The patient states that she felt okay throughout the year but in the last few week she has developed SOB, wheezing and productive greenish sputum. She has been using albuterol nebulizer with minimum improvement so far. No chest pain, no LE edema, no orthopnea or PND.    Home Medications  Prior to Admission medications   Medication Sig Start Date End Date Taking? Authorizing Provider  ALPRAZolam Prudy Feeler)  0.5 MG tablet Take 0.5 mg by mouth at bedtime as needed for anxiety.   Yes Historical Provider, MD  aspirin EC 81 MG tablet Take 81 mg by mouth as needed.    Yes Historical Provider, MD  metoprolol tartrate (LOPRESSOR) 25 MG tablet Take 0.5 tablets (12.5 mg total) by mouth every morning. 12/17/12  Yes Gaylord Shih, MD  VENTOLIN HFA 108 (90 BASE) MCG/ACT inhaler as directed. 11/16/13  Yes Historical Provider, MD    Family History  Family History  Problem Relation Age of Onset  . Lung cancer Paternal Grandfather   . Breast cancer Paternal Grandmother   . Colon cancer Father   . Stroke Mother   . Hypertension Mother   . Pancreatic cancer Maternal Aunt     Social History  History   Social History  . Marital Status: Married    Spouse Name: N/A    Number of Children: N/A  . Years of Education: N/A   Occupational History  . Not on file.   Social History Main Topics  . Smoking status: Former Games developer  . Smokeless tobacco: Not on file  . Alcohol Use: Yes     Comment: occasional  . Drug Use: No  . Sexual Activity: Not on file   Other Topics Concern  . Not on file   Social History Narrative     Review of Systems, as per HPI, otherwise negative General:  No chills, fever, night sweats or weight changes.  Cardiovascular:  No chest pain, dyspnea on exertion, edema, orthopnea, palpitations, paroxysmal nocturnal dyspnea. Dermatological: No rash, lesions/masses Respiratory: No cough, dyspnea Urologic: No hematuria, dysuria Abdominal:   No nausea, vomiting, diarrhea, bright red blood per rectum, melena, or hematemesis Neurologic:  No visual changes, wkns, changes in mental status. All other systems reviewed and are otherwise negative except as noted above.  Physical Exam  Blood pressure 118/78, pulse 85, height 5' 2.2" (1.58 m), weight 119 lb (53.978 kg).  General: Pleasant, NAD Psych: Normal affect. Neuro: Alert and oriented X 3. Moves all extremities spontaneously. HEENT:  Normal  Neck: Supple without bruits or JVD. Lungs:  Resp regular and unlabored, CTA. Heart: RRR no s3, s4, or murmurs. Abdomen: Soft, non-tender, non-distended, BS + x 4.  Extremities: No clubbing, cyanosis or edema. DP/PT/Radials 2+ and equal bilaterally.  Labs:  No results for input(s): CKTOTAL, CKMB, TROPONINI in the last 72 hours. Lab Results  Component Value Date   WBC 7.4 02/26/2014   HGB 13.0 02/26/2014   HCT 40.6 02/26/2014   MCV 89.0 02/26/2014   PLT 187 02/26/2014   No results for input(s): NA, K, CL, CO2, BUN, CREATININE, CALCIUM, PROT, BILITOT, ALKPHOS, ALT, AST, GLUCOSE in the last 168 hours.  Invalid input(s): LABALBU Lab Results  Component Value Date   CHOL  11/09/2009    114        ATP III CLASSIFICATION:  <200     mg/dL   Desirable  960-454200-239  mg/dL   Borderline High  >=098>=240    mg/dL   High          HDL 38* 11/09/2009   LDLCALC  11/09/2009    65        Total Cholesterol/HDL:CHD Risk Coronary Heart Disease Risk Table                     Men   Women  1/2 Average Risk   3.4   3.3  Average Risk       5.0   4.4  2 X Average Risk   9.6   7.1  3 X Average Risk  23.4   11.0        Use the calculated Patient Ratio above and the CHD Risk Table to determine the patient's CHD Risk.        ATP III CLASSIFICATION (LDL):  <100     mg/dL   Optimal  119-147100-129  mg/dL   Near or Above                    Optimal  130-159  mg/dL   Borderline  829-562160-189  mg/dL   High  >130>190     mg/dL   Very High   TRIG 54 86/57/846912/08/2009   Lab Results  Component Value Date   DDIMER 0.28 11/21/2012   Invalid input(s): POCBNP  Accessory Clinical Findings  Echocardiogram - 11/10/2009  - Left ventricle: The cavity size was mildly reduced. Wall thickness was normal. Systolic function was normal. The estimated ejection fraction was in the range of 55% to 60%. The interventricular septum was D-shaped in systole and diastole, suggesting RV pressure and volume overload. - Aortic valve: There  was no stenosis. - Mitral valve: Mild regurgitation. - Left atrium: The atrium was moderately dilated. No evidence of thrombus in the atrial cavity or appendage. - Pulmonary veins: 4 pulmonary veins (2 left, 2 right) visualized. They all appeared to drain normally into the left atrium. -  Right ventricle: The cavity size was severely dilated. Systolic function was mildly reduced. - Right atrium: The atrium was moderately dilated. - Atrial septum: There was a large 40 mm+ secundum atrial septal defect with primarily left to right flow. - Pericardium, extracardiac: A trivial pericardial effusion was identified. Impressions:  - The RV was severely dilated with mild systolic dysfunction. The LV was small and compressed by the RV. EF 55-60%. There was a large secundum ASD (40 mm+). The pulmonary veins appeared to drain normally to the left atrium. The ASD is too large for percutaneous closure.  Echocardiogram - 10/02/2010  Left ventricle: Systolic function was normal. The estimated ejection fraction was in the range of 55% to 60%. Aortic valve: Doppler: There was no stenosis. Mitral valve: There is flattened coaptation of the mitral valve leaflets without true prolapse. Mildly thickened leaflets . Doppler: Moderate regurgitation. Peak gradient: 3mm Hg (D). Atrial septum: No evidence of atrial shunting by color doppler. Right ventricle: The cavity size was normal. Wall thickness was normal. Systolic function was normal. Pulmonic valve: Doppler: Mild regurgitation. Tricuspid valve: Doppler: Mild regurgitation. Right atrium: The atrium was normal in size. Pericardium: There was no pericardial effusion. Systemic veins: Inferior vena cava: The vessel was normal in size; the respirophasic diameter changes were in the normal range (= 50%); findings are consistent with normal central venous pressure.   Echo 01/11/14  Left ventricle: The cavity size was normal. Wall thickness was  normal. Systolic function was normal. The estimated ejection fraction was 55%. Wall motion was normal; there were no regional wall motion abnormalities. Doppler parameters are consistent with abnormal left ventricular relaxation (grade 1 diastolic dysfunction). - Mitral valve: Calcified annulus posteriorly. Mildly thickened leaflets, anterior leaflet with somewhat myxomatous appearance and mildly restricted motion as well as with mild degree of prolapse relative to the posterior leaflet. Mild regurgitation directed eccentrically. Regurgitant volume: 11ml (PISA). - Left atrium: The atrium was mildly dilated. - Right ventricle: The cavity size was mildly dilated. - Right atrium: Central venous pressure: 3mm Hg (est). - Atrial septum: The septum was thickened - status post secundum ASD repair by records. No residual defect or patent foramen ovale was identified by color Doppler. - Tricuspid valve: Trivial regurgitation. - Pulmonary arteries: PA peak pressure: 30mm Hg (S). - Pericardium, extracardiac: A prominent pericardial fat pad was present - circumferential, cannot entirely exclude organized effusion.  Impressions:  - Normal LV wall thickness with LVEF 55%, grade 1 diastolic dysfunction. Mild left atrial enlargement. Mild posterior MAC with thickened anterior leaflet as outlined above,mild prolapse associated with mild eccentric regurgitation. Mild RV dilatation. Trivial tricuspid regurgitation with PASP 30 mmHg. No obvious residual interatrial shunting status post secundum ASD repair. Prominent epicardial fat pad, cannot exclude organized pericardail effusion.  ECG - NSR 67 BPM, normal ECG    Assessment & Plan  1.  H/o ASD repair, mild RV dilatation and no dysfunction, no pulmonary hypertension on the echo in 12/2013.   2. Acute bronchitis - start Z pack and a course of 5 days of steroids 40 mg po daily.   3. Chest pain - normal  ECG, normal treadmill stress test to evaluate for ischemia in 12/2013.   4. Lipids, not checked in a while, we will order today with CMP.  Follow up in 3 weeks.   Lars Masson, MD, [facility] 12/28/2014, 12:12 PM

## 2014-12-28 NOTE — Patient Instructions (Signed)
Your physician has recommended you make the following change in your medication:   STOP TAKING METOPROLOL   START TAKING Z-PACK (AZITHROMYCIN) FOR 5 DAYS---FOLLOW DIRECTIONS CAREFULLY  START TAKING PREDNISONE 40 MG DAILY FOR 5 DAYS ONLY    Your physician recommends that you return for lab work in: TODAY (BMET, MAGNESIUM, CBC W DIFF)    Your physician recommends that you schedule a follow-up appointment in: 2 WEEKS WITH DR Delton SeeNELSON

## 2014-12-29 LAB — MAGNESIUM: Magnesium: 1.9 mg/dL (ref 1.5–2.5)

## 2015-01-10 ENCOUNTER — Ambulatory Visit: Payer: Managed Care, Other (non HMO) | Admitting: Cardiology

## 2015-01-30 ENCOUNTER — Ambulatory Visit (INDEPENDENT_AMBULATORY_CARE_PROVIDER_SITE_OTHER): Payer: Commercial Indemnity | Admitting: Cardiology

## 2015-01-30 ENCOUNTER — Encounter: Payer: Self-pay | Admitting: Cardiology

## 2015-01-30 VITALS — BP 110/68 | HR 85 | Ht 62.2 in | Wt 121.0 lb

## 2015-01-30 DIAGNOSIS — R072 Precordial pain: Secondary | ICD-10-CM

## 2015-01-30 DIAGNOSIS — Q211 Atrial septal defect, unspecified: Secondary | ICD-10-CM

## 2015-01-30 DIAGNOSIS — J208 Acute bronchitis due to other specified organisms: Secondary | ICD-10-CM

## 2015-01-30 NOTE — Patient Instructions (Signed)
Your physician recommends that you continue on your current medications as directed. Please refer to the Current Medication list given to you today.  Your physician wants you to follow-up in: 6 MONTHS with Dr. Nelson. You will receive a reminder letter in the mail two months in advance. If you don't receive a letter, please call our office to schedule the follow-up appointment.  

## 2015-01-30 NOTE — Progress Notes (Signed)
Patient ID: [name], female   DOB: 25-Feb-1951, 64 y.o.   MRN: [id number]    Patient Name: [name] [name] Date of Encounter: 01/30/2015  Primary Care Provider:  Alice Reichert, MD Primary Cardiologist:  Lars Masson (prior Dr Daleen Squibb patient)  Problem List   Past Medical History  Diagnosis Date  . Hypertension     pulmonary  . Atrial septal defect   . COPD (chronic obstructive pulmonary disease)   . Dyspnea   . Kyphoscoliosis     thoracic spine   Past Surgical History  Procedure Laterality Date  . Tonsillectomy    . Cardiac surgery      Allergies  Allergies  Allergen Reactions  . Levofloxacin Shortness Of Breath  . Sulfonamide Derivatives Shortness Of Breath  . Moxifloxacin Other (See Comments)    hallucinations    HPI  [name] [name] returns today for followup of her atrial septal defect repair for a large ASD repaired surgically in 2010. 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 She reports improvement of DOE after surgery. She states that in November she had an episode of retrosternal chest pain that appeared after an argument with her husband. It was associated with significant SOB. She states that she experiences occasional SOB on moderate exertion, no syncope or palpitations, PND, or edema. She's taking much better care of herself than  prior to  surgery. She continues not to smoke.  12/28/2014 - the patient is coming after 1 year, the last year echo showed normal LVEF, normal RV function, mildly dilated, no pulmonary hypertension. The patient states that she felt okay throughout the year but in the last few week she has developed SOB, wheezing and productive greenish sputum. She has been using albuterol nebulizer with minimum improvement so far. No chest pain, no LE edema, no orthopnea or PND.   01/30/2015 - the patient is coming after 1 months, the last visit she suffered from acute bronchitis and was prescribed azithromycin and 5  discourse all steroids. She states that her symptoms have definitely improved and she only has some mild residual cough that is nonproductive. She otherwise denies chest pain, shortness of breath, no lower extremity edema palpitations or syncope.     Home Medications  Prior to Admission medications   Medication Sig Start Date End Date Taking? Authorizing Provider  ALPRAZolam Prudy Feeler) 0.5 MG tablet Take 0.5 mg by mouth at bedtime as needed for anxiety.   Yes Historical Provider, MD  aspirin EC 81 MG tablet Take 81 mg by mouth as needed.    Yes Historical Provider, MD  metoprolol tartrate (LOPRESSOR) 25 MG tablet Take 0.5 tablets (12.5 mg total) by mouth every morning. 12/17/12  Yes Gaylord Shih, MD  VENTOLIN HFA 108 (90 BASE) MCG/ACT inhaler as directed. 11/16/13  Yes Historical Provider, MD    Family History  Family History  Problem Relation Age of Onset  . Lung cancer Paternal Grandfather   . Breast cancer Paternal Grandmother   . Colon cancer Father   . Stroke Mother   . Hypertension Mother   . Pancreatic cancer Maternal Aunt     Social History  History   Social History  . Marital Status: Married    Spouse Name: N/A  . Number of Children: N/A  . Years of Education: N/A   Occupational History  . Not on file.   Social History Main Topics  . Smoking status: Former Games developer  . Smokeless tobacco: Not  on file  . Alcohol Use: Yes     Comment: occasional  . Drug Use: No  . Sexual Activity: Not on file   Other Topics Concern  . Not on file   Social History Narrative     Review of Systems, as per HPI, otherwise negative General:  No chills, fever, night sweats or weight changes.  Cardiovascular:  No chest pain, dyspnea on exertion, edema, orthopnea, palpitations, paroxysmal nocturnal dyspnea. Dermatological: No rash, lesions/masses Respiratory: No cough, dyspnea Urologic: No hematuria, dysuria Abdominal:   No nausea, vomiting, diarrhea, bright red blood per rectum,  melena, or hematemesis Neurologic:  No visual changes, wkns, changes in mental status. All other systems reviewed and are otherwise negative except as noted above.  Physical Exam  Blood pressure 110/68, pulse 85, height 5' 2.2" (1.58 m), weight 121 lb (54.885 kg), SpO2 95 %.  General: Pleasant, NAD Psych: Normal affect. Neuro: Alert and oriented X 3. Moves all extremities spontaneously. HEENT: Normal  Neck: Supple without bruits or JVD. Lungs:  Resp regular and unlabored, CTA. Heart: RRR no s3, s4, or murmurs. Abdomen: Soft, non-tender, non-distended, BS + x 4.  Extremities: No clubbing, cyanosis or edema. DP/PT/Radials 2+ and equal bilaterally.  Labs:  No results for input(s): CKTOTAL, CKMB, TROPONINI in the last 72 hours. Lab Results  Component Value Date   WBC 7.1 12/28/2014   HGB 13.7 12/28/2014   HCT 41.3 12/28/2014   MCV 86.6 12/28/2014   PLT 191.0 12/28/2014   No results for input(s): NA, K, CL, CO2, BUN, CREATININE, CALCIUM, PROT, BILITOT, ALKPHOS, ALT, AST, GLUCOSE in the last 168 hours.  Invalid input(s): LABALBU Lab Results  Component Value Date   CHOL  11/09/2009    114        ATP III CLASSIFICATION:  <200     mg/dL   Desirable  191-478  mg/dL   Borderline High  >=295    mg/dL   High          HDL 38* 11/09/2009   LDLCALC  11/09/2009    65        Total Cholesterol/HDL:CHD Risk Coronary Heart Disease Risk Table                     Men   Women  1/2 Average Risk   3.4   3.3  Average Risk       5.0   4.4  2 X Average Risk   9.6   7.1  3 X Average Risk  23.4   11.0        Use the calculated Patient Ratio above and the CHD Risk Table to determine the patient's CHD Risk.        ATP III CLASSIFICATION (LDL):  <100     mg/dL   Optimal  621-308  mg/dL   Near or Above                    Optimal  130-159  mg/dL   Borderline  657-846  mg/dL   High  >962     mg/dL   Very High   TRIG 54 95/28/4132   Lab Results  Component Value Date   DDIMER 0.28  11/21/2012   Invalid input(s): POCBNP  Accessory Clinical Findings  Echocardiogram - 11/10/2009  - Left ventricle: The cavity size was mildly reduced. Wall thickness was normal. Systolic function was normal. The estimated ejection fraction was in the range of 55% to 60%.  The interventricular septum was D-shaped in systole and diastole, suggesting RV pressure and volume overload. - Aortic valve: There was no stenosis. - Mitral valve: Mild regurgitation. - Left atrium: The atrium was moderately dilated. No evidence of thrombus in the atrial cavity or appendage. - Pulmonary veins: 4 pulmonary veins (2 left, 2 right) visualized. They all appeared to drain normally into the left atrium. - Right ventricle: The cavity size was severely dilated. Systolic function was mildly reduced. - Right atrium: The atrium was moderately dilated. - Atrial septum: There was a large 40 mm+ secundum atrial septal defect with primarily left to right flow. - Pericardium, extracardiac: A trivial pericardial effusion was identified. Impressions:  - The RV was severely dilated with mild systolic dysfunction. The LV was small and compressed by the RV. EF 55-60%. There was a large secundum ASD (40 mm+). The pulmonary veins appeared to drain normally to the left atrium. The ASD is too large for percutaneous closure.  Echocardiogram - 10/02/2010  Left ventricle: Systolic function was normal. The estimated ejection fraction was in the range of 55% to 60%. Aortic valve: Doppler: There was no stenosis. Mitral valve: There is flattened coaptation of the mitral valve leaflets without true prolapse. Mildly thickened leaflets . Doppler: Moderate regurgitation. Peak gradient: 3mm Hg (D). Atrial septum: No evidence of atrial shunting by color doppler. Right ventricle: The cavity size was normal. Wall thickness was normal. Systolic function was normal. Pulmonic valve: Doppler: Mild regurgitation. Tricuspid valve:  Doppler: Mild regurgitation. Right atrium: The atrium was normal in size. Pericardium: There was no pericardial effusion. Systemic veins: Inferior vena cava: The vessel was normal in size; the respirophasic diameter changes were in the normal range (= 50%); findings are consistent with normal central venous pressure.   Echo 01/11/14  Left ventricle: The cavity size was normal. Wall thickness was normal. Systolic function was normal. The estimated ejection fraction was 55%. Wall motion was normal; there were no regional wall motion abnormalities. Doppler parameters are consistent with abnormal left ventricular relaxation (grade 1 diastolic dysfunction). - Mitral valve: Calcified annulus posteriorly. Mildly thickened leaflets, anterior leaflet with somewhat myxomatous appearance and mildly restricted motion as well as with mild degree of prolapse relative to the posterior leaflet. Mild regurgitation directed eccentrically. Regurgitant volume: 11ml (PISA). - Left atrium: The atrium was mildly dilated. - Right ventricle: The cavity size was mildly dilated. - Right atrium: Central venous pressure: 3mm Hg (est). - Atrial septum: The septum was thickened - status post secundum ASD repair by records. No residual defect or patent foramen ovale was identified by color Doppler. - Tricuspid valve: Trivial regurgitation. - Pulmonary arteries: PA peak pressure: 30mm Hg (S). - Pericardium, extracardiac: A prominent pericardial fat pad was present - circumferential, cannot entirely exclude organized effusion.  Impressions:  - Normal LV wall thickness with LVEF 55%, grade 1 diastolic dysfunction. Mild left atrial enlargement. Mild posterior MAC with thickened anterior leaflet as outlined above,mild prolapse associated with mild eccentric regurgitation. Mild RV dilatation. Trivial tricuspid regurgitation with PASP 30 mmHg. No obvious residual interatrial  shunting status post secundum ASD repair. Prominent epicardial fat pad, cannot exclude organized pericardail effusion.  ECG - NSR 67 BPM, normal ECG    Assessment & Plan  1.  H/o ASD repair, mild RV dilatation and no dysfunction, no pulmonary hypertension on the echo in 12/2013.   2. Acute bronchitis - resolved with Z pack and a course of 5 days of steroids 40 mg po daily.   3.  Chest pain - normal ECG, normal treadmill stress test to evaluate for ischemia in 12/2013.   4. Lipids, not checked in a while, we will order today with CMP.  Follow up in 6 months.   Lars MassonNELSON, Mayo Owczarzak H, MD, [facility] 01/30/2015, 3:34 PM

## 2015-05-25 ENCOUNTER — Other Ambulatory Visit: Payer: Self-pay

## 2015-08-10 ENCOUNTER — Ambulatory Visit (INDEPENDENT_AMBULATORY_CARE_PROVIDER_SITE_OTHER): Payer: Managed Care, Other (non HMO) | Admitting: Cardiology

## 2015-08-10 ENCOUNTER — Encounter: Payer: Self-pay | Admitting: Cardiology

## 2015-08-10 VITALS — BP 124/62 | HR 67 | Ht 62.0 in | Wt 120.0 lb

## 2015-08-10 DIAGNOSIS — E785 Hyperlipidemia, unspecified: Secondary | ICD-10-CM | POA: Insufficient documentation

## 2015-08-10 DIAGNOSIS — Q2111 Secundum atrial septal defect: Secondary | ICD-10-CM

## 2015-08-10 DIAGNOSIS — Q211 Atrial septal defect: Secondary | ICD-10-CM

## 2015-08-10 DIAGNOSIS — R072 Precordial pain: Secondary | ICD-10-CM

## 2015-08-10 NOTE — Progress Notes (Signed)
Patient ID: [name] [name], female   DOB: 01-01-1951, 64 y.o.   MRN: [id number]    Patient Name: [name] [name] Date of Encounter: 08/10/2015  Primary Care Provider:  Alice Reichert, MD Primary Cardiologist:  Lars Masson (prior Dr Daleen Squibb patient)  Problem List   Past Medical History  Diagnosis Date  . Hypertension     pulmonary  . Atrial septal defect   . COPD (chronic obstructive pulmonary disease)   . Dyspnea   . Kyphoscoliosis     thoracic spine   Past Surgical History  Procedure Laterality Date  . Tonsillectomy    . Cardiac surgery      Allergies  Allergies  Allergen Reactions  . Levofloxacin Shortness Of Breath  . Sulfonamide Derivatives Shortness Of Breath  . Moxifloxacin Other (See Comments)    hallucinations    HPI  [name] [name] returns today for followup of her atrial septal defect repair for a large ASD repaired surgically in 2010. 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 She reports improvement of DOE after surgery. She states that in November she had an episode of retrosternal chest pain that appeared after an argument with her husband. It was associated with significant SOB. She states that she experiences occasional SOB on moderate exertion, no syncope or palpitations, PND, or edema. She's taking much better care of herself than  prior to  surgery. She continues not to smoke.  12/28/2014 - the patient is coming after 1 year, the last year echo showed normal LVEF, normal RV function, mildly dilated, no pulmonary hypertension. The patient states that she felt okay throughout the year but in the last few week she has developed SOB, wheezing and productive greenish sputum. She has been using albuterol nebulizer with minimum improvement so far. No chest pain, no LE edema, no orthopnea or PND.   01/30/2015 - the patient is coming after 1 months, the last visit she suffered from acute bronchitis and was prescribed azithromycin and 5  discourse all steroids. She states that her symptoms have definitely improved and she only has some mild residual cough that is nonproductive. She otherwise denies chest pain, shortness of breath, no lower extremity edema palpitations or syncope.  07/11/15 - 6 months follow up, she feels well, no CP or SOB, no orthopnea, PND, no LE edema, palpitations or syncope.   Home Medications  Prior to Admission medications   Medication Sig Start Date End Date Taking? Authorizing Provider  ALPRAZolam Prudy Feeler) 0.5 MG tablet Take 0.5 mg by mouth at bedtime as needed for anxiety.   Yes Historical Provider, MD  aspirin EC 81 MG tablet Take 81 mg by mouth as needed.    Yes Historical Provider, MD  metoprolol tartrate (LOPRESSOR) 25 MG tablet Take 0.5 tablets (12.5 mg total) by mouth every morning. 12/17/12  Yes Gaylord Shih, MD  VENTOLIN HFA 108 (90 BASE) MCG/ACT inhaler as directed. 11/16/13  Yes Historical Provider, MD    Family History  Family History  Problem Relation Age of Onset  . Lung cancer Paternal Grandfather   . Breast cancer Paternal Grandmother   . Colon cancer Father   . Stroke Mother   . Hypertension Mother   . Pancreatic cancer Maternal Aunt     Social History  Social History   Social History  . Marital Status: Married    Spouse Name: N/A  . Number of Children: N/A  . Years of Education: N/A   Occupational History  .  Not on file.   Social History Main Topics  . Smoking status: Former Games developer  . Smokeless tobacco: Not on file  . Alcohol Use: Yes     Comment: occasional  . Drug Use: No  . Sexual Activity: Not on file   Other Topics Concern  . Not on file   Social History Narrative     Review of Systems, as per HPI, otherwise negative General:  No chills, fever, night sweats or weight changes.  Cardiovascular:  No chest pain, dyspnea on exertion, edema, orthopnea, palpitations, paroxysmal nocturnal dyspnea. Dermatological: No rash, lesions/masses Respiratory: No  cough, dyspnea Urologic: No hematuria, dysuria Abdominal:   No nausea, vomiting, diarrhea, bright red blood per rectum, melena, or hematemesis Neurologic:  No visual changes, wkns, changes in mental status. All other systems reviewed and are otherwise negative except as noted above.  Physical Exam  Blood pressure 124/62, pulse 67, height  (1.575 m), weight 120 lb (54.432 kg), SpO2 98 %.  General: Pleasant, NAD Psych: Normal affect. Neuro: Alert and oriented X 3. Moves all extremities spontaneously. HEENT: Normal  Neck: Supple without bruits or JVD. Lungs:  Resp regular and unlabored, CTA. Heart: RRR no s3, s4, or murmurs. Abdomen: Soft, non-tender, non-distended, BS + x 4.  Extremities: No clubbing, cyanosis or edema. DP/PT/Radials 2+ and equal bilaterally.  Labs:  No results for input(s): CKTOTAL, CKMB, TROPONINI in the last 72 hours. Lab Results  Component Value Date   WBC 7.1 12/28/2014   HGB 13.7 12/28/2014   HCT 41.3 12/28/2014   MCV 86.6 12/28/2014   PLT 191.0 12/28/2014   No results for input(s): NA, K, CL, CO2, BUN, CREATININE, CALCIUM, PROT, BILITOT, ALKPHOS, ALT, AST, GLUCOSE in the last 168 hours.  Invalid input(s): LABALBU Lab Results  Component Value Date   CHOL  11/09/2009    114        ATP III CLASSIFICATION:  <200     mg/dL   Desirable  161-096  mg/dL   Borderline High  >=045    mg/dL   High          HDL 38* 11/09/2009   LDLCALC  11/09/2009    65        Total Cholesterol/HDL:CHD Risk Coronary Heart Disease Risk Table                     Men   Women  1/2 Average Risk   3.4   3.3  Average Risk       5.0   4.4  2 X Average Risk   9.6   7.1  3 X Average Risk  23.4   11.0        Use the calculated Patient Ratio above and the CHD Risk Table to determine the patient's CHD Risk.        ATP III CLASSIFICATION (LDL):  <100     mg/dL   Optimal  409-811  mg/dL   Near or Above                    Optimal  130-159  mg/dL   Borderline  914-782  mg/dL    High  >956     mg/dL   Very High   TRIG 54 21/30/8657   Lab Results  Component Value Date   DDIMER 0.28 11/21/2012   Invalid input(s): POCBNP  Accessory Clinical Findings  Echocardiogram - 11/10/2009  - Left ventricle: The cavity size was mildly reduced.  Wall thickness was normal. Systolic function was normal. The estimated ejection fraction was in the range of 55% to 60%. The interventricular septum was D-shaped in systole and diastole, suggesting RV pressure and volume overload. - Aortic valve: There was no stenosis. - Mitral valve: Mild regurgitation. - Left atrium: The atrium was moderately dilated. No evidence of thrombus in the atrial cavity or appendage. - Pulmonary veins: 4 pulmonary veins (2 left, 2 right) visualized. They all appeared to drain normally into the left atrium. - Right ventricle: The cavity size was severely dilated. Systolic function was mildly reduced. - Right atrium: The atrium was moderately dilated. - Atrial septum: There was a large 40 mm+ secundum atrial septal defect with primarily left to right flow. - Pericardium, extracardiac: A trivial pericardial effusion was identified. Impressions:  - The RV was severely dilated with mild systolic dysfunction. The LV was small and compressed by the RV. EF 55-60%. There was a large secundum ASD (40 mm+). The pulmonary veins appeared to drain normally to the left atrium. The ASD is too large for percutaneous closure.  Echocardiogram - 10/02/2010  Left ventricle: Systolic function was normal. The estimated ejection fraction was in the range of 55% to 60%. Aortic valve: Doppler: There was no stenosis. Mitral valve: There is flattened coaptation of the mitral valve leaflets without true prolapse. Mildly thickened leaflets . Doppler: Moderate regurgitation. Peak gradient: 3mm Hg (D). Atrial septum: No evidence of atrial shunting by color doppler. Right ventricle: The cavity size was normal. Wall  thickness was normal. Systolic function was normal. Pulmonic valve: Doppler: Mild regurgitation. Tricuspid valve: Doppler: Mild regurgitation. Right atrium: The atrium was normal in size. Pericardium: There was no pericardial effusion. Systemic veins: Inferior vena cava: The vessel was normal in size; the respirophasic diameter changes were in the normal range (= 50%); findings are consistent with normal central venous pressure.   Echo 01/11/14  Left ventricle: The cavity size was normal. Wall thickness was normal. Systolic function was normal. The estimated ejection fraction was 55%. Wall motion was normal; there were no regional wall motion abnormalities. Doppler parameters are consistent with abnormal left ventricular relaxation (grade 1 diastolic dysfunction). - Mitral valve: Calcified annulus posteriorly. Mildly thickened leaflets, anterior leaflet with somewhat myxomatous appearance and mildly restricted motion as well as with mild degree of prolapse relative to the posterior leaflet. Mild regurgitation directed eccentrically. Regurgitant volume: 11ml (PISA). - Left atrium: The atrium was mildly dilated. - Right ventricle: The cavity size was mildly dilated. - Right atrium: Central venous pressure: 3mm Hg (est). - Atrial septum: The septum was thickened - status post secundum ASD repair by records. No residual defect or patent foramen ovale was identified by color Doppler. - Tricuspid valve: Trivial regurgitation. - Pulmonary arteries: PA peak pressure: 30mm Hg (S). - Pericardium, extracardiac: A prominent pericardial fat pad was present - circumferential, cannot entirely exclude organized effusion.  Impressions:  - Normal LV wall thickness with LVEF 55%, grade 1 diastolic dysfunction. Mild left atrial enlargement. Mild posterior MAC with thickened anterior leaflet as outlined above,mild prolapse associated with mild eccentric  regurgitation. Mild RV dilatation. Trivial tricuspid regurgitation with PASP 30 mmHg. No obvious residual interatrial shunting status post secundum ASD repair. Prominent epicardial fat pad, cannot exclude organized pericardail effusion.  ECG - NSR 67 BPM, normal ECG    Assessment & Plan  1.  H/o ASD repair, mild RV dilatation and no dysfunction, no pulmonary hypertension on the echo in 12/2013, unchanged in 01/2015, repeat in  2-3 years.   2. Acute bronchitis - resolved with Z pack and a course of 5 days of steroids 40 mg po daily.   3. Chest pain - normal ECG, normal treadmill stress test to evaluate for ischemia in 12/2013.   4. Lipids, not checked in a while, we will order today with CMP.  Follow up in 1 year.   Lars Masson, MD, [facility] 08/10/2015, 11:56 AM

## 2015-08-10 NOTE — Patient Instructions (Signed)
Medication Instructions:   Your physician recommends that you continue on your current medications as directed. Please refer to the Current Medication list given to you today.     Labwork:  DR NELSON HAS HAND WRITTEN A PRESCRIPTION FOR YOU TO TAKE TO YOUR PCP TO HAVE FASTING LIPIDS DONE.--PLEASE HAVE THEM FAX THE RESULTS TO DR Delton See AT [phone].        Follow-Up:  ONE YEAR WITH DR Delton See

## 2015-08-22 ENCOUNTER — Telehealth: Payer: Self-pay | Admitting: Cardiology

## 2015-08-22 ENCOUNTER — Other Ambulatory Visit: Payer: Self-pay | Admitting: Obstetrics & Gynecology

## 2015-08-22 NOTE — Telephone Encounter (Signed)
New message ° ° ° ° ° ° °Calling to get lab results °

## 2015-08-22 NOTE — Telephone Encounter (Signed)
Pt calling to ask if her PCP office from [facility] faxed her lab results for Dr Delton See to review and advise on.  Pt states that Dr Delton See ordered a lipid panel to be drawn on the pt at her PCP office in [place], for this was an easier commute for the pt to have her labs done, for when she saw Dr Delton See in the office, she was not fasting.  Informed the pt that we have not received any lab results from her PCP office yet for Dr Delton See to review.  Informed the pt she should contact her PCP office now and give them our fax number at [phone] attention Dr Delton See and nurse, and have them send this for her to review.  Pt verbalized understanding, agrees with this plan, and states she will contact them right now.

## 2015-08-28 ENCOUNTER — Encounter: Payer: Self-pay | Admitting: Cardiology

## 2015-08-28 ENCOUNTER — Telehealth: Payer: Self-pay | Admitting: Cardiology

## 2015-08-28 NOTE — Telephone Encounter (Signed)
New problem   Pt need to speak to nurse concerning her recent labs she had done. Please advise

## 2015-08-28 NOTE — Telephone Encounter (Signed)
Pt states that Dr. Renard Matter checked her lipid panel and informed her that her cholesterol was high. Pt wanted to know if we had received the labs from their office yet. Pt states that Dr. Renard Matter suggested started a statin medication and she states that she does not wish to start one at this time. Unable to locate labs.  Called Dr. Renard Matter' office and requested labs again.

## 2015-08-29 NOTE — Telephone Encounter (Signed)
Received the pts recent lipid results faxed from her PCP to our office for Dr Delton See to review and advise on.   Results are scanned into the pts chart and available for Dr Delton See to review and advise on. Will follow-up with the pt with any new recommendations from Dr Delton See.  Will send this message to Dr Delton See as an Lorain Childes.

## 2015-08-30 NOTE — Telephone Encounter (Signed)
Left message for the pt to call back to inform her that Dr Delton See reviewed her labs sent by her PCP, and has some recommendations.

## 2015-08-30 NOTE — Telephone Encounter (Signed)
Notified the pt that per Dr Delton See she reviewed her labs from her PCP and she states that she has normal triglycerides and high HDL, which is a great thing, however her LDL is severly elevated.  Informed the pt that per Dr Delton See, she agrees with the pts PCP that she should start taking low dose atorvastatin 10 mg po daily, and have her lipids and cmet repeated in 2 months.  Pt firmly stated back that "I told my PCP I was not going to take any kind of statin, I don't care how bad my numbers are."  Pt states "It's my body, and I refuse to put a statin in it."  Tried providing pt education on the need for a statin, and the pt completely refused this recommendation.  Pt states "I will change my diet and lifestyle around before starting any kind of statin." Informed the pt that I will route this message to Dr Delton See, to see if she has any other recommendations and to ask if a follow-up lipid and cmet will be needed, and follow-up with the pt thereafter.  Pt verbalized understanding and agrees with this plan.

## 2015-08-30 NOTE — Telephone Encounter (Signed)
She has normal triglycerides and high HDL (thats great), however severely elevated LDL. I agree with her PCP and would start her on a low dose atorvastatin 10 mg PO daily and repeat lipids + CMP in 2 months.

## 2015-09-01 ENCOUNTER — Other Ambulatory Visit (HOSPITAL_COMMUNITY)
Admission: RE | Admit: 2015-09-01 | Discharge: 2015-09-01 | Disposition: A | Discharge status: Home / Self Care | Payer: Managed Care, Other (non HMO) | Source: Ambulatory Visit | Attending: Obstetrics & Gynecology | Admitting: Obstetrics & Gynecology

## 2015-09-01 ENCOUNTER — Ambulatory Visit (INDEPENDENT_AMBULATORY_CARE_PROVIDER_SITE_OTHER): Payer: 59 | Admitting: Obstetrics & Gynecology

## 2015-09-01 ENCOUNTER — Encounter: Payer: Self-pay | Admitting: Obstetrics & Gynecology

## 2015-09-01 VITALS — BP 110/80 | HR 72 | Ht 62.2 in | Wt 120.0 lb

## 2015-09-01 DIAGNOSIS — Z01419 Encounter for gynecological examination (general) (routine) without abnormal findings: Secondary | ICD-10-CM

## 2015-09-01 DIAGNOSIS — Z1211 Encounter for screening for malignant neoplasm of colon: Secondary | ICD-10-CM

## 2015-09-01 DIAGNOSIS — Z1212 Encounter for screening for malignant neoplasm of rectum: Secondary | ICD-10-CM

## 2015-09-01 DIAGNOSIS — E038 Other specified hypothyroidism: Secondary | ICD-10-CM

## 2015-09-01 MED ORDER — METRONIDAZOLE 0.75 % VA GEL: VAGINAL | Status: DC

## 2015-09-01 MED ORDER — LEVOTHYROXINE SODIUM 50 MCG PO TABS: 50.0000 ug | ORAL_TABLET | Freq: Every day | ORAL | Status: DC

## 2015-09-01 NOTE — Progress Notes (Signed)
Patient ID: [name], female   DOB: [birth year], 64 y.o.   MRN: [id number] Subjective:     [name] [name] is a 63 y.o. female here for a routine exam.  No LMP recorded. Patient is postmenopausal. No obstetric history on file. Birth Control Method:  Post menopausal Menstrual Calendar(currently): amenorrheic  Current complaints: none.   Current acute medical issues:  none   Recent Gynecologic History No LMP recorded. Patient is postmenopausal. Last Pap: 2015,  normal Last mammogram: 2016,  normal  Past Medical History  Diagnosis Date  . Hypertension     pulmonary  . Atrial septal defect   . COPD (chronic obstructive pulmonary disease)   . Dyspnea   . Kyphoscoliosis     thoracic spine  . Hyperlipidemia     Past Surgical History  Procedure Laterality Date  . Tonsillectomy    . Cardiac surgery      OB History    No data available      Social History   Social History  . Marital Status: Married    Spouse Name: N/A  . Number of Children: N/A  . Years of Education: N/A   Social History Main Topics  . Smoking status: Former Games developer  . Smokeless tobacco: None  . Alcohol Use: Yes     Comment: occasional  . Drug Use: No  . Sexual Activity: Not Asked   Other Topics Concern  . None   Social History Narrative    Family History  Problem Relation Age of Onset  . Lung cancer Paternal Grandfather   . Breast cancer Paternal Grandmother   . Colon cancer Father   . Stroke Mother   . Hypertension Mother   . Pancreatic cancer Maternal Aunt      Current outpatient prescriptions:  .  ALPRAZolam (XANAX) 0.5 MG tablet, Take 0.5 mg by mouth at bedtime as needed for anxiety., Disp: , Rfl:  .  aspirin EC 81 MG tablet, Take 81 mg by mouth as needed. , Disp: , Rfl:  .  SYNTHROID 50 MCG tablet, TAKE ONE TABLET BY MOUTH DAILY., Disp: 30 tablet, Rfl: 11 .  VENTOLIN HFA 108 (90 BASE) MCG/ACT inhaler, as directed., Disp: , Rfl:   Review of Systems  Review of Systems   Constitutional: Negative for fever, chills, weight loss, malaise/fatigue and diaphoresis.  HENT: Negative for hearing loss, ear pain, nosebleeds, congestion, sore throat, neck pain, tinnitus and ear discharge.   Eyes: Negative for blurred vision, double vision, photophobia, pain, discharge and redness.  Respiratory: Negative for cough, hemoptysis, sputum production, shortness of breath, wheezing and stridor.   Cardiovascular: Negative for chest pain, palpitations, orthopnea, claudication, leg swelling and PND.  Gastrointestinal: negative for abdominal pain. Negative for heartburn, nausea, vomiting, diarrhea, constipation, blood in stool and melena.  Genitourinary: Negative for dysuria, urgency, frequency, hematuria and flank pain.  Musculoskeletal: Negative for myalgias, back pain, joint pain and falls.  Skin: Negative for itching and rash.  Neurological: Negative for dizziness, tingling, tremors, sensory change, speech change, focal weakness, seizures, loss of consciousness, weakness and headaches.  Endo/Heme/Allergies: Negative for environmental allergies and polydipsia. Does not bruise/bleed easily.  Psychiatric/Behavioral: Negative for depression, suicidal ideas, hallucinations, memory loss and substance abuse. The patient is not nervous/anxious and does not have insomnia.        Objective:  Blood pressure 110/80, pulse 72, height 5' 2.2" (1.58 m), weight 120 lb (54.432 kg).   Physical Exam  Vitals reviewed. Constitutional: She is oriented to person,  place, and time. She appears well-developed and well-nourished.  HENT:  Head: Normocephalic and atraumatic.        Right Ear: External ear normal.  Left Ear: External ear normal.  Nose: Nose normal.  Mouth/Throat: Oropharynx is clear and moist.  Eyes: Conjunctivae and EOM are normal. Pupils are equal, round, and reactive to light. Right eye exhibits no discharge. Left eye exhibits no discharge. No scleral icterus.  Neck: Normal range of  motion. Neck supple. No tracheal deviation present. No thyromegaly present.  Cardiovascular: Normal rate, regular rhythm, normal heart sounds and intact distal pulses.  Exam reveals no gallop and no friction rub.   No murmur heard. Respiratory: Effort normal and breath sounds normal. No respiratory distress. She has no wheezes. She has no rales. She exhibits no tenderness.  GI: Soft. Bowel sounds are normal. She exhibits no distension and no mass. There is no tenderness. There is no rebound and no guarding.  Genitourinary:  Breasts no masses skin changes or nipple changes bilaterally      Vulva is normal without lesions Vagina is pink moist without discharge Cervix normal in appearance and pap is done Uterus is normal size shape and contour Adnexa is negative with normal sized ovaries  {Rectal    hemoccult negative, normal tone, no masses  Musculoskeletal: Normal range of motion. She exhibits no edema and no tenderness.  Neurological: She is alert and oriented to person, place, and time. She has normal reflexes. She displays normal reflexes. No cranial nerve deficit. She exhibits normal muscle tone. Coordination normal.  Skin: Skin is warm and dry. No rash noted. No erythema. No pallor.  Psychiatric: She has a normal mood and affect. Her behavior is normal. Judgment and thought content normal.       Assessment:    Healthy female exam.    Plan:    Mammogram ordered. Follow up in: 1 year. metronidazole gel

## 2015-09-01 NOTE — Addendum Note (Signed)
Addended by: Lazaro Arms on: 09/01/2015 11:36 AM   Modules accepted: Orders

## 2015-09-02 LAB — TSH: TSH: 2.06 u[IU]/mL (ref 0.450–4.500)

## 2015-09-04 LAB — CYTOLOGY - PAP

## 2016-06-03 ENCOUNTER — Ambulatory Visit (HOSPITAL_COMMUNITY)
Admission: RE | Admit: 2016-06-03 | Discharge: 2016-06-03 | Disposition: A | Discharge status: Home / Self Care | Payer: Managed Care, Other (non HMO) | Source: Ambulatory Visit | Attending: Family Medicine | Admitting: Family Medicine

## 2016-06-03 ENCOUNTER — Other Ambulatory Visit (HOSPITAL_COMMUNITY): Payer: Self-pay | Admitting: Family Medicine

## 2016-06-03 DIAGNOSIS — M79672 Pain in left foot: Secondary | ICD-10-CM | POA: Diagnosis present

## 2016-08-12 ENCOUNTER — Encounter: Payer: Self-pay | Admitting: Physician Assistant

## 2016-08-12 ENCOUNTER — Ambulatory Visit (INDEPENDENT_AMBULATORY_CARE_PROVIDER_SITE_OTHER): Payer: Managed Care, Other (non HMO) | Admitting: Physician Assistant

## 2016-08-12 VITALS — BP 118/66 | HR 73 | Ht 62.2 in | Wt 121.1 lb

## 2016-08-12 DIAGNOSIS — E785 Hyperlipidemia, unspecified: Secondary | ICD-10-CM

## 2016-08-12 DIAGNOSIS — I450 Right fascicular block: Secondary | ICD-10-CM

## 2016-08-12 DIAGNOSIS — Q211 Atrial septal defect: Secondary | ICD-10-CM

## 2016-08-12 DIAGNOSIS — Q2111 Secundum atrial septal defect: Secondary | ICD-10-CM

## 2016-08-12 DIAGNOSIS — I4510 Unspecified right bundle-branch block: Secondary | ICD-10-CM

## 2016-08-12 NOTE — Progress Notes (Signed)
Cardiology Office Note    Date:  08/12/2016   ID:  [name] [name], DOB 11/21/1951, MRN [id number]  PCP:  Milana Obey, MD  Cardiologist: Dr. Delton See  Chief Complaint  Patient presents with  . Follow-up    History of Present Illness:  [name] [name] is a 65 y.o. 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 She reports improvement of DOE after surgery. Patient last seen by Dr. Delton See 08/2015.  Patient had normal GXT for chest pain 2015. Also has hyperlipidemia but refuses to take a statin.  Patient comes in for yearly Checkup. She denies any chest pain, palpitations, dyspnea, dyspnea on exertion, dizziness or presyncope. She feels an occasional skip but no other symptoms. She doesn't do any regular exercise other than her housework and going up and downstairs for laundry. She admits to eating a lot of ice cream and cheese and has tried to cut back on it a little.     Past Medical History:  Diagnosis Date  . Atrial septal defect   . COPD (chronic obstructive pulmonary disease) (HCC)   . Dyspnea   . Hyperlipidemia   . Hypertension    pulmonary  . Kyphoscoliosis    thoracic spine    Past Surgical History:  Procedure Laterality Date  . CARDIAC SURGERY    . TONSILLECTOMY      Current Medications: Outpatient Medications Prior to Visit  Medication Sig Dispense Refill  . ALPRAZolam (XANAX) 0.5 MG tablet Take 0.5 mg by mouth at bedtime as needed for anxiety.    Marland Kitchen aspirin EC 81 MG tablet Take 81 mg by mouth as needed.     Marland Kitchen levothyroxine (SYNTHROID) 50 MCG tablet Take 1 tablet (50 mcg total) by mouth daily. 30 tablet 11  . VENTOLIN HFA 108 (90 BASE) MCG/ACT inhaler as directed.    . metroNIDAZOLE (METROGEL VAGINAL) 0.75 % vaginal gel Nightly x 5 nights (Patient not taking: Reported on 08/12/2016) 70 g 0   No facility-administered medications prior to visit.      Allergies:   Levofloxacin; Sulfonamide  derivatives; and Moxifloxacin   Social History   Social History  . Marital status: Married    Spouse name: N/A  . Number of children: N/A  . Years of education: N/A   Social History Main Topics  . Smoking status: Former Games developer  . Smokeless tobacco: Never Used  . Alcohol use Yes     Comment: occasional  . Drug use: No  . Sexual activity: Not Asked   Other Topics Concern  . None   Social History Narrative  . None     Family History:  The patient's   family history includes Breast cancer in her paternal grandmother; Colon cancer in her father; Hypertension in her mother; Lung cancer in her paternal grandfather; Pancreatic cancer in her maternal aunt; Stroke in her mother.   ROS:   Please see the history of present illness.    Review of Systems  Constitution: Negative.  HENT: Positive for hearing loss.   Eyes: Positive for visual disturbance.  Cardiovascular: Positive for irregular heartbeat.  Respiratory: Positive for cough and wheezing.   Hematologic/Lymphatic: Negative.   Musculoskeletal: Negative.  Negative for joint pain.  Gastrointestinal: Negative.   Genitourinary: Negative.   Neurological: Positive for loss of balance.  Psychiatric/Behavioral: The patient is nervous/anxious.    All other systems reviewed and are negative.   PHYSICAL EXAM:  VS:  BP 118/66   Pulse 73   Ht 5' 2.2" (1.58 m)   Wt 121 lb 1.9 oz (54.9 kg)   BMI 22.01 kg/m   Physical Exam  GEN: Well nourished, well developed, in no acute distress  Neck: no JVD, carotid bruits, or masses Cardiac:RRR; valvular clicks, no murmurs, rubs, or gallops  Respiratory:  clear to auscultation bilaterally, normal work of breathing GI: soft, nontender, nondistended, + BS Ext: without cyanosis, clubbing, or edema, Good distal pulses bilaterally MS: no deformity or atrophy  Skin: warm and dry, no rash Psych: euthymic mood, full affect  Wt Readings from Last 3 Encounters:  08/12/16 121 lb 1.9 oz (54.9 kg)    09/01/15 120 lb (54.4 kg)  08/10/15 120 lb (54.4 kg)      Studies/Labs Reviewed:   EKG:  EKG is ordered today.  The ekg ordered today demonstrates Normal sinus rhythm with a new incomplete right bundle branch block  Recent Labs: 09/01/2015: TSH 2.060   Lipid Panel    Component Value Date/Time   CHOL  11/09/2009 0455    114        ATP III CLASSIFICATION:  <200     mg/dL   Desirable  161-096  mg/dL   Borderline High  >=045    mg/dL   High          TRIG 54 11/09/2009 0455   HDL 38 (L) 11/09/2009 0455   CHOLHDL 3.0 11/09/2009 0455   VLDL 11 11/09/2009 0455   LDLCALC  11/09/2009 0455    65        Total Cholesterol/HDL:CHD Risk Coronary Heart Disease Risk Table                     Men   Women  1/2 Average Risk   3.4   3.3  Average Risk       5.0   4.4  2 X Average Risk   9.6   7.1  3 X Average Risk  23.4   11.0        Use the calculated Patient Ratio above and the CHD Risk Table to determine the patient's CHD Risk.        ATP III CLASSIFICATION (LDL):  <100     mg/dL   Optimal  409-811  mg/dL   Near or Above                    Optimal  130-159  mg/dL   Borderline  914-782  mg/dL   High  >956     mg/dL   Very High    Additional studies/ records that were reviewed today include:   Echo 01/11/14   Left ventricle: The cavity size was normal. Wall thickness   was normal. Systolic function was normal. The estimated   ejection fraction was 55%. Wall motion was normal; there   were no regional wall motion abnormalities. Doppler   parameters are consistent with abnormal left ventricular   relaxation (grade 1 diastolic dysfunction). - Mitral valve: Calcified annulus posteriorly. Mildly   thickened leaflets, anterior leaflet with somewhat   myxomatous appearance and mildly restricted motion as well   as with mild degree of prolapse relative to the posterior   leaflet. Mild regurgitation directed eccentrically.   Regurgitant volume: 11ml (PISA). - Left atrium: The  atrium was mildly dilated. - Right ventricle: The cavity size was mildly dilated. - Right atrium: Central venous pressure: 3mm Hg (est). -  Atrial septum: The septum was thickened - status post   secundum ASD repair by records. No residual defect or   patent foramen ovale was identified by color Doppler. - Tricuspid valve: Trivial regurgitation. - Pulmonary arteries: PA peak pressure: 30mm Hg (S). - Pericardium, extracardiac: A prominent pericardial fat pad   was present - circumferential, cannot entirely exclude   organized effusion.  Impressions:  - Normal LV wall thickness with LVEF 55%, grade 1 diastolic   dysfunction. Mild left atrial enlargement. Mild posterior   MAC with thickened anterior leaflet as outlined above,mild   prolapse associated with mild eccentric regurgitation.   Mild RV dilatation. Trivial tricuspid regurgitation with   PASP 30 mmHg. No obvious residual interatrial shunting   status post secundum ASD repair. Prominent epicardial fat   pad, cannot exclude organized pericardail effusion.  ECG - NSR 67 BPM, normal ECG      ASSESSMENT:    1. ATRIAL SEPTAL DEFECT, SECUNDUM TYPE   2. Hyperlipidemia      PLAN:  In order of problems listed above: ASD repair in 2010, doing well, asymptomatic. New incomplete right bundle branch block on EKG. Follow-up with Dr. Delton SeeNelson in 1 year.  Hyperlipidemia has refused to take a statin. Continues to eat a lot of ice cream, whole milk, and cheese. Recommend a low fat diet, increase exercise. Red yeast rice.  Incomplete right bundle branch block on EKG is new for patient. Will discuss with Dr. Delton SeeNelson to see if she wants to order an echo or a follow.     Medication Adjustments/Labs and Tests Ordered: Current medicines are reviewed at length with the patient today.  Concerns regarding medicines are outlined above.  Medication changes, Labs and Tests ordered today are listed in the Patient Instructions below. Patient  Instructions  Medication Instructions:   Your physician recommends that you continue on your current medications as directed. Please refer to the Current Medication list given to you today.'  If you need a refill on your cardiac medications before your next appointment, please call your pharmacy.  Labwork: NONE ORDER TODAY    Testing/Procedures: NONE ORDER TODAY    Follow-Up: Your physician wants you to follow-up in: ONE YEAR WITH  NELSON You will receive a reminder letter in the mail two months in advance. If you don't receive a letter, please call our office to schedule the follow-up appointment.     Any Other Special Instructions Will Be Listed Below (If Applicable).                                                                                                                                                      Elson ClanSigned, Tamera Pingley, PA-C  08/12/2016 12:44 PM    [facility] [address], [place], [place]  [id number] Phone: [phone];  Fax: [phone]

## 2016-08-12 NOTE — Patient Instructions (Addendum)
Medication Instructions:   Your physician recommends that you continue on your current medications as directed. Please refer to the Current Medication list given to you today.'  If you need a refill on your cardiac medications before your next appointment, please call your pharmacy.  Labwork: NONE ORDER TODAY    Testing/Procedures: NONE ORDER TODAY    Follow-Up: Your physician wants you to follow-up in: ONE YEAR WITH  NELSON You will receive a reminder letter in the mail two months in advance. If you don't receive a letter, please call our office to schedule the follow-up appointment.     Any Other Special Instructions Will Be Listed Below (If Applicable).

## 2016-09-07 ENCOUNTER — Other Ambulatory Visit: Payer: Self-pay | Admitting: Obstetrics & Gynecology

## 2017-08-22 ENCOUNTER — Encounter: Payer: Self-pay | Admitting: Cardiology

## 2017-08-22 ENCOUNTER — Ambulatory Visit (INDEPENDENT_AMBULATORY_CARE_PROVIDER_SITE_OTHER): Payer: 59 | Admitting: Cardiology

## 2017-08-22 ENCOUNTER — Encounter (INDEPENDENT_AMBULATORY_CARE_PROVIDER_SITE_OTHER): Payer: Self-pay

## 2017-08-22 VITALS — BP 124/60 | HR 73 | Ht 62.2 in | Wt 124.0 lb

## 2017-08-22 DIAGNOSIS — E7849 Other hyperlipidemia: Secondary | ICD-10-CM

## 2017-08-22 DIAGNOSIS — E784 Other hyperlipidemia: Secondary | ICD-10-CM | POA: Diagnosis not present

## 2017-08-22 DIAGNOSIS — Q211 Atrial septal defect: Secondary | ICD-10-CM

## 2017-08-22 DIAGNOSIS — I4510 Unspecified right bundle-branch block: Secondary | ICD-10-CM | POA: Diagnosis not present

## 2017-08-22 DIAGNOSIS — Q2111 Secundum atrial septal defect: Secondary | ICD-10-CM

## 2017-08-22 NOTE — Patient Instructions (Addendum)
Medication Instructions:   Your physician recommends that you continue on your current medications as directed. Please refer to the Current Medication list given to you today.    Testing/Procedures:  Your physician has requested that you have an echocardiogram. Echocardiography is a painless test that uses sound waves to create images of your heart. It provides your doctor with information about the size and shape of your heart and how well your heart's chambers and valves are working. This procedure takes approximately one hour. There are no restrictions for this procedure.  PLEASE SCHEDULE ECHO FOR Dec 02, 2017 PER DR NELSON---DO WITH BUBBLE STUDY    Follow-Up:  Your physician wants you to follow-up in: ONE YEAR WITH DR Johnell Comings will receive a reminder letter in the mail two months in advance. If you don't receive a letter, please call our office to schedule the follow-up appointment.        If you need a refill on your cardiac medications before your next appointment, please call your pharmacy.

## 2017-08-22 NOTE — Progress Notes (Signed)
Cardiology Office Note    Date:  08/22/2017   ID:  [name] [name], DOB December 22, 1950, MRN [id number]  PCP:  Lemmie Evens, MD  Cardiologist: Dr. Meda Coffee  Chief complain: 1 year follow up  History of Present Illness:  [name] [name] is a 66 y.o. 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 She reports improvement of DOE after surgery. Patient last seen by Dr. Meda Coffee 08/2015. Patient had normal GXT for chest pain 2015. Also has hyperlipidemia but refuses to take a statin.  08/22/2017 - this is one year follow-up, the patient has been doing great she denies any chest pain shortness of breath, she remains active, she doesn't eat very healthy, likes sweets and ice cream, her PCP recently checked her labs including lipids that were elevated, however she is clear about that she doesn't want standard taking statins. She denies any stroke-like symptoms limiting slurred speech dizziness weakness, denies any lower extremity edema orthopnea or paroxysmal nocturnal dyspnea.  Past Medical History:  Diagnosis Date  . Atrial septal defect   . COPD (chronic obstructive pulmonary disease) ([facility])   . Dyspnea   . Hyperlipidemia   . Hypertension    pulmonary  . Kyphoscoliosis    thoracic spine    Past Surgical History:  Procedure Laterality Date  . CARDIAC SURGERY    . TONSILLECTOMY      Current Medications: Outpatient Medications Prior to Visit  Medication Sig Dispense Refill  . ALPRAZolam (XANAX) 0.5 MG tablet Take 0.5 mg by mouth at bedtime as needed for anxiety.    Marland Kitchen aspirin EC 81 MG tablet Take 81 mg by mouth as needed.     Marland Kitchen SYNTHROID 50 MCG tablet TAKE ONE TABLET BY MOUTH DAILY. 30 tablet 3  . VENTOLIN HFA 108 (90 BASE) MCG/ACT inhaler as directed.     No facility-administered medications prior to visit.      Allergies:   Levofloxacin; Sulfonamide derivatives; and Moxifloxacin   Social History   Social History  .  Marital status: Married    Spouse name: N/A  . Number of children: N/A  . Years of education: N/A   Social History Main Topics  . Smoking status: Former Research scientist (life sciences)  . Smokeless tobacco: Never Used  . Alcohol use Yes     Comment: occasional  . Drug use: No  . Sexual activity: Not Asked   Other Topics Concern  . None   Social History Narrative  . None     Family History:  The patient's   family history includes Breast cancer in her paternal grandmother; Colon cancer in her father; Hypertension in her mother; Lung cancer in her paternal grandfather; Pancreatic cancer in her maternal aunt; Stroke in her mother.   ROS:   Please see the history of present illness.    Review of Systems  Constitution: Negative.  HENT: Positive for hearing loss.   Eyes: Positive for visual disturbance.  Cardiovascular: Positive for irregular heartbeat.  Respiratory: Positive for cough and wheezing.   Hematologic/Lymphatic: Negative.   Musculoskeletal: Negative.  Negative for joint pain.  Gastrointestinal: Negative.   Genitourinary: Negative.   Neurological: Positive for loss of balance.  Psychiatric/Behavioral: The patient is nervous/anxious.    All other systems reviewed and are negative.   PHYSICAL EXAM:   VS:  BP 124/60   Pulse 73   Ht 5' 2.2" (1.58 m)   Wt 124 lb (56.2 kg)  BMI 22.53 kg/m   Physical Exam  GEN: Well nourished, well developed, in no acute distress  Neck: no JVD, carotid bruits, or masses Cardiac:RRR; valvular clicks, no murmurs, rubs, or gallops  Respiratory:  clear to auscultation bilaterally, normal work of breathing GI: soft, nontender, nondistended, + BS Ext: without cyanosis, clubbing, or edema, Good distal pulses bilaterally MS: no deformity or atrophy  Skin: warm and dry, no rash Psych: euthymic mood, full affect  Wt Readings from Last 3 Encounters:  08/22/17 124 lb (56.2 kg)  08/12/16 121 lb 1.9 oz (54.9 kg)  09/01/15 120 lb (54.4 kg)      Studies/Labs  Reviewed:   EKG:  EKG is ordered today.  The ekg ordered today demonstrates Normal sinus rhythm with a new incomplete right bundle branch block  Recent Labs: No results found for requested labs within last 8760 hours.   Lipid Panel    Component Value Date/Time   CHOL  11/09/2009 0455    114        ATP III CLASSIFICATION:  <200     mg/dL   Desirable  200-239  mg/dL   Borderline High  >=240    mg/dL   High          TRIG 54 11/09/2009 0455   HDL 38 (L) 11/09/2009 0455   CHOLHDL 3.0 11/09/2009 0455   VLDL 11 11/09/2009 0455   LDLCALC  11/09/2009 0455    65        Total Cholesterol/HDL:CHD Risk Coronary Heart Disease Risk Table                     Men   Women  1/2 Average Risk   3.4   3.3  Average Risk       5.0   4.4  2 X Average Risk   9.6   7.1  3 X Average Risk  23.4   11.0        Use the calculated Patient Ratio above and the CHD Risk Table to determine the patient's CHD Risk.        ATP III CLASSIFICATION (LDL):  <100     mg/dL   Optimal  100-129  mg/dL   Near or Above                    Optimal  130-159  mg/dL   Borderline  160-189  mg/dL   High  >190     mg/dL   Very High    Additional studies/ records that were reviewed today include:   Echo 01/11/14   Left ventricle: The cavity size was normal. Wall thickness   was normal. Systolic function was normal. The estimated   ejection fraction was 55%. Wall motion was normal; there   were no regional wall motion abnormalities. Doppler   parameters are consistent with abnormal left ventricular   relaxation (grade 1 diastolic dysfunction). - Mitral valve: Calcified annulus posteriorly. Mildly   thickened leaflets, anterior leaflet with somewhat   myxomatous appearance and mildly restricted motion as well   as with mild degree of prolapse relative to the posterior   leaflet. Mild regurgitation directed eccentrically.   Regurgitant volume: 40m (PISA). - Left atrium: The atrium was mildly dilated. - Right  ventricle: The cavity size was mildly dilated. - Right atrium: Central venous pressure: 362mHg (est). - Atrial septum: The septum was thickened - status post   secundum ASD repair by records. No residual defect  or   patent foramen ovale was identified by color Doppler. - Tricuspid valve: Trivial regurgitation. - Pulmonary arteries: PA peak pressure: 92m Hg (S). - Pericardium, extracardiac: A prominent pericardial fat pad   was present - circumferential, cannot entirely exclude   organized effusion.  Impressions:  - Normal LV wall thickness with LVEF 582% grade 1 diastolic   dysfunction. Mild left atrial enlargement. Mild posterior   MAC with thickened anterior leaflet as outlined above,mild   prolapse associated with mild eccentric regurgitation.   Mild RV dilatation. Trivial tricuspid regurgitation with   PASP 30 mmHg. No obvious residual interatrial shunting   status post secundum ASD repair. Prominent epicardial fat   pad, cannot exclude organized pericardail effusion.  ECG - formed today 08/22/2017 was personally reviewed and shows normal sinus rhythm, incomplete right bundle branch block this is unchanged from prior.      ASSESSMENT:    1. ATRIAL SEPTAL DEFECT, SECUNDUM TYPE   2. Other hyperlipidemia   3. Incomplete right bundle branch block      PLAN:  In order of problems listed above:  1. ASD repair in 2010, doing well, asymptomatic. Echocardiogram performed in 2015 showing most residual ASD LVF 1555%, we will repeat now.  2. Hyperlipidemia - the patient is clear about not wanting to take statins that were also advised by her PCP, she doesn't have much interest in changing her diet either.  3. Incomplete right bundle branch block noticed on EKG at the last visit, unchanged today, we'll repeat echocardiogram.   Medication Adjustments/Labs and Tests Ordered: Current medicines are reviewed at length with the patient today.  Concerns regarding medicines are outlined  above.  Medication changes, Labs and Tests ordered today are listed in the Patient Instructions below. Patient Instructions  Medication Instructions:   Your physician recommends that you continue on your current medications as directed. Please refer to the Current Medication list given to you today.    Testing/Procedures:  Your physician has requested that you have an echocardiogram. Echocardiography is a painless test that uses sound waves to create images of your heart. It provides your doctor with information about the size and shape of your heart and how well your heart's chambers and valves are working. This procedure takes approximately one hour. There are no restrictions for this procedure.  PLEASE SCHEDULE ECHO FOR Dec 02, 2017 PER DR Pollyann Roa    Follow-Up:  Your physician wants you to follow-up in: [place] reminder letter in the mail two months in advance. If you don't receive a letter, please call our office to schedule the follow-up appointment.        If you need a refill on your cardiac medications before your next appointment, please call your pharmacy.      Signed, KEna Dawley MD  08/22/2017 2:58 PM    [place]  [id number]: [phone] Fax: [phone]

## 2017-12-05 ENCOUNTER — Other Ambulatory Visit (HOSPITAL_COMMUNITY): Payer: Self-pay | Admitting: Family Medicine

## 2017-12-05 DIAGNOSIS — Z1231 Encounter for screening mammogram for malignant neoplasm of breast: Secondary | ICD-10-CM

## 2017-12-10 ENCOUNTER — Other Ambulatory Visit: Payer: Self-pay

## 2017-12-10 ENCOUNTER — Ambulatory Visit (HOSPITAL_COMMUNITY): Payer: 59 | Attending: Cardiovascular Disease

## 2017-12-10 DIAGNOSIS — E7849 Other hyperlipidemia: Secondary | ICD-10-CM | POA: Insufficient documentation

## 2017-12-10 DIAGNOSIS — Q211 Atrial septal defect: Secondary | ICD-10-CM | POA: Diagnosis present

## 2017-12-10 DIAGNOSIS — Q2111 Secundum atrial septal defect: Secondary | ICD-10-CM

## 2017-12-10 DIAGNOSIS — J449 Chronic obstructive pulmonary disease, unspecified: Secondary | ICD-10-CM | POA: Diagnosis not present

## 2017-12-10 DIAGNOSIS — I313 Pericardial effusion (noninflammatory): Secondary | ICD-10-CM | POA: Diagnosis not present

## 2017-12-10 DIAGNOSIS — I119 Hypertensive heart disease without heart failure: Secondary | ICD-10-CM | POA: Insufficient documentation

## 2017-12-10 DIAGNOSIS — Z9889 Other specified postprocedural states: Secondary | ICD-10-CM | POA: Diagnosis not present

## 2017-12-10 DIAGNOSIS — E785 Hyperlipidemia, unspecified: Secondary | ICD-10-CM | POA: Diagnosis not present

## 2017-12-10 DIAGNOSIS — I4510 Unspecified right bundle-branch block: Secondary | ICD-10-CM | POA: Insufficient documentation

## 2017-12-24 ENCOUNTER — Ambulatory Visit (HOSPITAL_COMMUNITY)
Admission: RE | Admit: 2017-12-24 | Discharge: 2017-12-24 | Disposition: A | Discharge status: Home / Self Care | Payer: 59 | Source: Ambulatory Visit | Attending: Family Medicine | Admitting: Family Medicine

## 2017-12-24 DIAGNOSIS — Z1231 Encounter for screening mammogram for malignant neoplasm of breast: Secondary | ICD-10-CM | POA: Diagnosis not present

## 2018-09-17 ENCOUNTER — Ambulatory Visit: Payer: 59 | Admitting: Cardiology

## 2018-11-19 ENCOUNTER — Emergency Department (HOSPITAL_COMMUNITY): Payer: Medicare HMO

## 2018-11-19 ENCOUNTER — Encounter (HOSPITAL_COMMUNITY): Payer: Self-pay | Admitting: Emergency Medicine

## 2018-11-19 ENCOUNTER — Other Ambulatory Visit: Payer: Self-pay

## 2018-11-19 ENCOUNTER — Emergency Department (HOSPITAL_COMMUNITY)
Admission: EM | Admit: 2018-11-19 | Discharge: 2018-11-19 | Disposition: A | Discharge status: Home / Self Care | Payer: Medicare HMO | Attending: Emergency Medicine | Admitting: Emergency Medicine

## 2018-11-19 DIAGNOSIS — Z87891 Personal history of nicotine dependence: Secondary | ICD-10-CM | POA: Insufficient documentation

## 2018-11-19 DIAGNOSIS — Z7982 Long term (current) use of aspirin: Secondary | ICD-10-CM | POA: Insufficient documentation

## 2018-11-19 DIAGNOSIS — J449 Chronic obstructive pulmonary disease, unspecified: Secondary | ICD-10-CM | POA: Insufficient documentation

## 2018-11-19 DIAGNOSIS — I10 Essential (primary) hypertension: Secondary | ICD-10-CM | POA: Diagnosis not present

## 2018-11-19 DIAGNOSIS — R05 Cough: Secondary | ICD-10-CM | POA: Diagnosis present

## 2018-11-19 DIAGNOSIS — Z79899 Other long term (current) drug therapy: Secondary | ICD-10-CM | POA: Diagnosis not present

## 2018-11-19 DIAGNOSIS — R0602 Shortness of breath: Secondary | ICD-10-CM | POA: Diagnosis not present

## 2018-11-19 DIAGNOSIS — R7989 Other specified abnormal findings of blood chemistry: Secondary | ICD-10-CM | POA: Insufficient documentation

## 2018-11-19 DIAGNOSIS — J209 Acute bronchitis, unspecified: Secondary | ICD-10-CM | POA: Diagnosis not present

## 2018-11-19 LAB — BASIC METABOLIC PANEL
Anion gap: 9 (ref 5–15)
BUN: 11 mg/dL (ref 8–23)
CO2: 30 mmol/L (ref 22–32)
CREATININE: 0.63 mg/dL (ref 0.44–1.00)
Calcium: 9.3 mg/dL (ref 8.9–10.3)
Chloride: 103 mmol/L (ref 98–111)
GFR calc Af Amer: >60 mL/min (ref >60)
GFR calc non Af Amer: >60 mL/min (ref >60)
Glucose, Bld: 104 mg/dL — ABNORMAL HIGH (ref 70–99)
Potassium: 3.4 mmol/L — ABNORMAL LOW (ref 3.5–5.1)
Sodium: 142 mmol/L (ref 135–145)

## 2018-11-19 LAB — CBC WITH DIFFERENTIAL/PLATELET
Abs Immature Granulocytes: 0.01 10*3/uL (ref 0.00–0.07)
Basophils Absolute: 0.1 10*3/uL (ref 0.0–0.1)
Basophils Relative: 1 %
EOS PCT: 8 %
Eosinophils Absolute: 0.5 10*3/uL (ref 0.0–0.5)
HCT: 42 % (ref 36.0–46.0)
Hemoglobin: 13 g/dL (ref 12.0–15.0)
Immature Granulocytes: 0 %
Lymphocytes Relative: 20 %
Lymphs Abs: 1.3 10*3/uL (ref 0.7–4.0)
MCH: 28.6 pg (ref 26.0–34.0)
MCHC: 31 g/dL (ref 30.0–36.0)
MCV: 92.3 fL (ref 80.0–100.0)
Monocytes Absolute: 0.6 10*3/uL (ref 0.1–1.0)
Monocytes Relative: 9 %
Neutro Abs: 4.1 10*3/uL (ref 1.7–7.7)
Neutrophils Relative %: 62 %
Platelets: 149 10*3/uL — ABNORMAL LOW (ref 150–400)
RBC: 4.55 MIL/uL (ref 3.87–5.11)
RDW: 12.7 % (ref 11.5–15.5)
WBC: 6.7 10*3/uL (ref 4.0–10.5)
nRBC: 0 % (ref 0.0–0.2)

## 2018-11-19 LAB — I-STAT TROPONIN, ED: Troponin i, poc: 0.01 ng/mL (ref 0.00–0.08)

## 2018-11-19 LAB — BRAIN NATRIURETIC PEPTIDE: B Natriuretic Peptide: 190 pg/mL — ABNORMAL HIGH (ref 0.0–100.0)

## 2018-11-19 MED ORDER — MAGNESIUM SULFATE 2 GM/50ML IV SOLN
2.0000 g | Freq: Once | INTRAVENOUS | Status: AC
  Administered 2018-11-19: 2 g via INTRAVENOUS
  Filled 2018-11-19: qty 50

## 2018-11-19 MED ORDER — IPRATROPIUM-ALBUTEROL 0.5-2.5 (3) MG/3ML IN SOLN
3.0000 mL | Freq: Once | RESPIRATORY_TRACT | Status: AC
  Administered 2018-11-19: 3 mL via RESPIRATORY_TRACT
  Filled 2018-11-19: qty 3

## 2018-11-19 MED ORDER — HYDROCOD POLST-CPM POLST ER 10-8 MG/5ML PO SUER: 5.0000 mL | Freq: Two times a day (BID) | ORAL | 0 refills | Status: DC | PRN

## 2018-11-19 MED ORDER — POTASSIUM CHLORIDE CRYS ER 20 MEQ PO TBCR
40.0000 meq | EXTENDED_RELEASE_TABLET | Freq: Once | ORAL | Status: AC
  Administered 2018-11-19: 40 meq via ORAL
  Filled 2018-11-19: qty 2

## 2018-11-19 MED ORDER — AZITHROMYCIN 250 MG PO TABS: 250.0000 mg | ORAL_TABLET | Freq: Every day | ORAL | 0 refills | Status: DC

## 2018-11-19 MED ORDER — METHYLPREDNISOLONE SODIUM SUCC 125 MG IJ SOLR
125.0000 mg | Freq: Once | INTRAMUSCULAR | Status: AC
  Administered 2018-11-19: 125 mg via INTRAVENOUS
  Filled 2018-11-19: qty 2

## 2018-11-19 MED ORDER — LORAZEPAM 2 MG/ML IJ SOLN
1.0000 mg | Freq: Once | INTRAMUSCULAR | Status: AC
  Administered 2018-11-19: 1 mg via INTRAVENOUS
  Filled 2018-11-19: qty 1

## 2018-11-19 MED ORDER — PREDNISONE 10 MG PO TABS: ORAL_TABLET | ORAL | 0 refills | Status: DC

## 2018-11-19 NOTE — ED Notes (Signed)
NT in room

## 2018-11-19 NOTE — ED Provider Notes (Signed)
[facility] EMERGENCY DEPARTMENT Provider Note   CSN: [id number] Arrival date & time: 11/19/18  [id number]     History   Chief Complaint Chief Complaint  Patient presents with  . Shortness of Breath    HPI [name] [name] is a 67 y.o. female.  The history is provided by the patient.  Shortness of Breath   She has history of hypertension, hyperlipidemia, ASD repair and comes in with 3-day history of cough productive of green sputum and worsening dyspnea.  Dyspnea got much worse overnight.  She has given herself 2 nebulizer treatments with only slight improvement.  Oxygen saturation at home was 88%, where it normally runs 97-98%.  She denies fever, chills, sweats.  She denies chest pain.  She is a former smoker.  She denies any sick contact.  She has not had influenza immunization this year.  Past Medical History:  Diagnosis Date  . Atrial septal defect   . COPD (chronic obstructive pulmonary disease) (HCC)   . Dyspnea   . Hyperlipidemia   . Hypertension    pulmonary  . Kyphoscoliosis    thoracic spine    Patient Active Problem List   Diagnosis Date Noted  . Incomplete right bundle branch block 08/12/2016  . Hyperlipidemia 08/10/2015  . TACHYCARDIA 12/29/2009  . HYPERTENSION, PULMONARY 04/01/2009  . COPD 04/01/2009  . KYPHOSCOLIOSIS, THORACIC SPINE 04/01/2009  . ATRIAL SEPTAL DEFECT, SECUNDUM TYPE 04/01/2009  . DOE (dyspnea on exertion) 04/01/2009    Past Surgical History:  Procedure Laterality Date  . CARDIAC SURGERY    . TONSILLECTOMY       OB History   No obstetric history on file.      Home Medications    Prior to Admission medications   Medication Sig Start Date End Date Taking? Authorizing Provider  ALPRAZolam Prudy Feeler(XANAX) 0.5 MG tablet Take 0.5 mg by mouth at bedtime as needed for anxiety.    [provider]  aspirin EC 81 MG tablet Take 81 mg by mouth as needed.     [provider]  SYNTHROID 50 MCG tablet TAKE ONE TABLET BY MOUTH DAILY.  09/08/16   Lazaro ArmsEure, Luther H, MD  VENTOLIN HFA 108 (90 BASE) MCG/ACT inhaler as directed. 11/16/13   [provider]    Family History Family History  Problem Relation Age of Onset  . Lung cancer Paternal Grandfather   . Breast cancer Paternal Grandmother   . Colon cancer Father   . Stroke Mother   . Hypertension Mother   . Pancreatic cancer Maternal Aunt     Social History Social History   Tobacco Use  . Smoking status: Former Games developermoker  . Smokeless tobacco: Never Used  Substance Use Topics  . Alcohol use: Yes    Comment: occasional  . Drug use: No     Allergies   Levofloxacin; Sulfonamide derivatives; and Moxifloxacin   Review of Systems Review of Systems  Respiratory: Positive for shortness of breath.   All other systems reviewed and are negative.    Physical Exam Updated Vital Signs BP (!) 152/93   Pulse (!) 112   Temp 98 F (36.7 C)   Resp (!) 22   Ht 5\' 1"  (1.549 m)   Wt 54.4 kg   SpO2 (!) 87%   BMI 22.67 kg/m   Physical Exam Vitals signs and nursing note reviewed.    67 year old female, moderately dyspneic, but in no acute distress. Vital signs are significant for elevated heart rate, respiratory rate,  blood pressure. Oxygen saturation is 87%, which is hypoxic. Head is normocephalic and atraumatic. PERRLA, EOMI. Oropharynx is clear. Neck is nontender and supple without adenopathy or JVD. Back is nontender and there is no CVA tenderness. Lungs have diminished airflow with diffuse inspiratory and expiratory wheezes. Chest is nontender. Heart has regular rate and rhythm without murmur. Abdomen is soft, flat, nontender without masses or hepatosplenomegaly and peristalsis is normoactive. Extremities have no cyanosis or edema, full range of motion is present. Skin is warm and dry without rash. Neurologic: Mental status is normal, cranial nerves are intact, there are no motor or sensory deficits.  ED Treatments / Results  Labs (all labs ordered  are listed, but only abnormal results are displayed) Labs Reviewed  BASIC METABOLIC PANEL  CBC WITH DIFFERENTIAL/PLATELET  BRAIN NATRIURETIC PEPTIDE  I-STAT TROPONIN, ED    EKG EKG Interpretation  Date/Time:  Thursday November 19 2018 05:32:32 EST Ventricular Rate:  102 PR Interval:    QRS Duration: 118 QT Interval:  361 QTC Calculation: 471 R Axis:   78 Text Interpretation:  Sinus tachycardia Probable left atrial enlargement Incomplete right bundle branch block Borderline repolarization abnormality Baseline wander in lead(s) V3 When compared with ECG of 02/26/2014, No significant change was found Confirmed by Dione BoozeGlick, Mikeyla Music ([id number]) on 11/19/2018 5:39:47 AM   Radiology No results found.  Procedures Procedures  CRITICAL CARE Performed by: Dione Boozeavid Syria Kestner Total critical care time: 40 minutes Critical care time was exclusive of separately billable procedures and treating other patients. Critical care was necessary to treat or prevent imminent or life-threatening deterioration. Critical care was time spent personally by me on the following activities: development of treatment plan with patient and/or surrogate as well as nursing, discussions with consultants, evaluation of patient's response to treatment, examination of patient, obtaining history from patient or surrogate, ordering and performing treatments and interventions, ordering and review of laboratory studies, ordering and review of radiographic studies, pulse oximetry and re-evaluation of patient's condition.  Medications Ordered in ED Medications  methylPREDNISolone sodium succinate (SOLU-MEDROL) 125 mg/2 mL injection 125 mg (has no administration in time range)  ipratropium-albuterol (DUONEB) 0.5-2.5 (3) MG/3ML nebulizer solution 3 mL (has no administration in time range)     Initial Impression / Assessment and Plan / ED Course  I have reviewed the triage vital signs and the nursing notes.  Pertinent labs & imaging results  that were available during my care of the patient were reviewed by me and considered in my medical decision making (see chart for details).  Cough and dyspnea most likely to be either bronchitis or pneumonia.  Old records are reviewed, and she does have prior ED visits for COPD and bronchitis, but none in the last 4 years.  Will get chest x-ray to rule out pneumonia.  She will be given albuterol with ipratropium via nebulizer and will be given initial dose of methylprednisolone.  ECG is not significantly changed from baseline.  With history of ASD repair, will also check BNP.  6:43 AM Chest x-ray shows no evidence of pneumonia.  Troponin is normal.  BNP is mildly elevated at 190.  Potassium is borderline low probably related to repeated albuterol doses.  She is given a dose of oral potassium.  We will also give intravenous magnesium.  She is feeling somewhat better after nebulizer treatment but is still in mild respiratory distress.  There is improved airflow, but still considerable wheezing.  A second nebulizer treatment has been ordered.  7:23 AM She is much  more comfortable.  Wheezing is completely gone.  She still has diminished airflow, but oxygen saturation at rest is now 96-97%.  She will be given 1 more nebulizer treatment and then assessed for stability for discharge.  Case is signed out to Dr. Adriana Simas.  Final Clinical Impressions(s) / ED Diagnoses   Final diagnoses:  Acute bronchitis, unspecified organism  Elevated brain natriuretic peptide (BNP) level    ED Discharge Orders    None       Dione Booze, MD 11/19/18 [phone]

## 2018-11-19 NOTE — ED Triage Notes (Signed)
Pt c/o productive cough, wheezing, and states her oxygen was 88% at home.

## 2018-11-19 NOTE — ED Notes (Signed)
Called respiratory for ned treatment

## 2018-11-19 NOTE — Discharge Instructions (Addendum)
Prescription for antibiotic, cough syrup, prednisone.  Use your nebulizer machine.  Return here if worse.

## 2018-11-19 NOTE — ED Provider Notes (Signed)
Patient rechecked by examiner several times.  I encouraged her to be admitted to the hospital.  She preferred to go home.  I discussed all of the clinical scenarios.  Discharge medications Zithromax, Tussionex, prednisone.   Donnetta Hutchingook, Aleathea Pugmire, MD 11/19/18 1247

## 2018-11-19 NOTE — ED Notes (Signed)
Ok to be off cardiac monitor for now by edp

## 2018-11-19 NOTE — ED Notes (Signed)
Pt ambulated to br

## 2018-12-14 ENCOUNTER — Encounter: Payer: Self-pay | Admitting: Cardiology

## 2018-12-14 ENCOUNTER — Ambulatory Visit (INDEPENDENT_AMBULATORY_CARE_PROVIDER_SITE_OTHER): Payer: Medicare HMO | Admitting: Cardiology

## 2018-12-14 VITALS — BP 126/74 | HR 78 | Ht 61.0 in | Wt 123.2 lb

## 2018-12-14 DIAGNOSIS — E7849 Other hyperlipidemia: Secondary | ICD-10-CM

## 2018-12-14 DIAGNOSIS — Z8774 Personal history of (corrected) congenital malformations of heart and circulatory system: Secondary | ICD-10-CM

## 2018-12-14 NOTE — Progress Notes (Signed)
Cardiology Office Note    Date:  12/14/2018   ID:  [name] [name], DOB 01/17/1951, MRN [id number]  PCP:  Gareth Morgan, MD  Cardiologist: Dr. Delton See  Chief complain: 1 year follow up  History of Present Illness:  [name] [name] is a 68 y.o. 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 She reports improvement of DOE after surgery. Patient last seen by Dr. Delton See 08/2015. Patient had normal GXT for chest pain 2015. Also has hyperlipidemia but refuses to take a statin.  08/22/2017 - this is one year follow-up, the patient has been doing great she denies any chest pain shortness of breath, she remains active, she doesn't eat very healthy, likes sweets and ice cream, her PCP recently checked her labs including lipids that were elevated, however she is clear about that she doesn't want standard taking statins. She denies any stroke-like symptoms limiting slurred speech dizziness weakness, denies any lower extremity edema orthopnea or paroxysmal nocturnal dyspnea.  12/14/2018 -this is 1 year follow-up: The patient is doing great except for episode of bronchitis in December that she is recovering from.  Otherwise denies lower extremity edema claudications no chest pain palpitations.  She admits to eating ice cream and cheese that causes elevated cholesterol.  Past Medical History:  Diagnosis Date  . Atrial septal defect   . COPD (chronic obstructive pulmonary disease) (HCC)   . Dyspnea   . Hyperlipidemia   . Hypertension    pulmonary  . Kyphoscoliosis    thoracic spine    Past Surgical History:  Procedure Laterality Date  . CARDIAC SURGERY    . TONSILLECTOMY      Current Medications: Outpatient Medications Prior to Visit  Medication Sig Dispense Refill  . ALPRAZolam (XANAX) 0.5 MG tablet Take 0.5 mg by mouth at bedtime as needed for anxiety.    Marland Kitchen azithromycin (ZITHROMAX) 250 MG tablet Take 1 tablet (250 mg total) by  mouth daily. Take first 2 tablets together, then 1 every day until finished. 6 tablet 0  . chlorpheniramine-HYDROcodone (TUSSIONEX PENNKINETIC ER) 10-8 MG/5ML SUER Take 5 mLs by mouth every 12 (twelve) hours as needed for cough. 140 mL 0  . predniSONE (DELTASONE) 10 MG tablet 3 tabs for 3 days, 2 tabs for 3 days, 1 tab for 3 days. 18 tablet 0  . SYNTHROID 50 MCG tablet TAKE ONE TABLET BY MOUTH DAILY. 30 tablet 3  . VENTOLIN HFA 108 (90 BASE) MCG/ACT inhaler as directed.     No facility-administered medications prior to visit.      Allergies:   Levofloxacin; Sulfonamide derivatives; and Moxifloxacin   Social History   Socioeconomic History  . Marital status: Married    Spouse name: Not on file  . Number of children: Not on file  . Years of education: Not on file  . Highest education level: Not on file  Occupational History  . Not on file  Social Needs  . Financial resource strain: Not on file  . Food insecurity:    Worry: Not on file    Inability: Not on file  . Transportation needs:    Medical: Not on file    Non-medical: Not on file  Tobacco Use  . Smoking status: Former Games developer  . Smokeless tobacco: Never Used  Substance and Sexual Activity  . Alcohol use: Yes    Comment: occasional  . Drug use: No  . Sexual activity: Not on  file  Lifestyle  . Physical activity:    Days per week: Not on file    Minutes per session: Not on file  . Stress: Not on file  Relationships  . Social connections:    Talks on phone: Not on file    Gets together: Not on file    Attends religious service: Not on file    Active member of club or organization: Not on file    Attends meetings of clubs or organizations: Not on file    Relationship status: Not on file  Other Topics Concern  . Not on file  Social History Narrative  . Not on file     Family History:  The patient's   family history includes Breast cancer in her paternal grandmother; Colon cancer in her father; Hypertension in her  mother; Lung cancer in her paternal grandfather; Pancreatic cancer in her maternal aunt; Stroke in her mother.   ROS:   Please see the history of present illness.    Review of Systems  Constitution: Negative.  HENT: Positive for hearing loss.   Eyes: Positive for visual disturbance.  Cardiovascular: Positive for irregular heartbeat.  Respiratory: Positive for cough and wheezing.   Hematologic/Lymphatic: Negative.   Musculoskeletal: Negative.  Negative for joint pain.  Gastrointestinal: Negative.   Genitourinary: Negative.   Neurological: Positive for loss of balance.  Psychiatric/Behavioral: The patient is nervous/anxious.    All other systems reviewed and are negative.   PHYSICAL EXAM:   VS:  BP 126/74   Pulse 78   Ht 5\' 1"  (1.549 m)   Wt 123 lb 3.2 oz (55.9 kg)   BMI 23.28 kg/m   Physical Exam  GEN: Well nourished, well developed, in no acute distress  Neck: no JVD, carotid bruits, or masses Cardiac:RRR; valvular clicks, no murmurs, rubs, or gallops  Respiratory:  clear to auscultation bilaterally, normal work of breathing GI: soft, nontender, nondistended, + BS Ext: without cyanosis, clubbing, or edema, Good distal pulses bilaterally MS: no deformity or atrophy  Skin: warm and dry, no rash Psych: euthymic mood, full affect  Wt Readings from Last 3 Encounters:  12/14/18 123 lb 3.2 oz (55.9 kg)  11/19/18 120 lb (54.4 kg)  08/22/17 124 lb (56.2 kg)      Studies/Labs Reviewed:   EKG:  EKG is ordered today.  The ekg ordered today demonstrates Normal sinus rhythm with a new incomplete right bundle branch block  Recent Labs: 11/19/2018: B Natriuretic Peptide 190.0; BUN 11; Creatinine, Ser 0.63; Hemoglobin 13.0; Platelets 149; Potassium 3.4; Sodium 142   Lipid Panel    Component Value Date/Time   CHOL  11/09/2009 0455    114        ATP III CLASSIFICATION:  <200     mg/dL   Desirable  161-096  mg/dL   Borderline High  >=045    mg/dL   High          TRIG 54  11/09/2009 0455   HDL 38 (L) 11/09/2009 0455   CHOLHDL 3.0 11/09/2009 0455   VLDL 11 11/09/2009 0455   LDLCALC  11/09/2009 0455    65        Total Cholesterol/HDL:CHD Risk Coronary Heart Disease Risk Table                     Men   Women  1/2 Average Risk   3.4   3.3  Average Risk       5.0  4.4  2 X Average Risk   9.6   7.1  3 X Average Risk  23.4   11.0        Use the calculated Patient Ratio above and the CHD Risk Table to determine the patient's CHD Risk.        ATP III CLASSIFICATION (LDL):  <100     mg/dL   Optimal  161-096100-129  mg/dL   Near or Above                    Optimal  130-159  mg/dL   Borderline  045-409160-189  mg/dL   High  >811>190     mg/dL   Very High    Additional studies/ records that were reviewed today include:   Echo January 2019   - Left ventricle: The cavity size was normal. Wall thickness was   increased in a pattern of mild LVH. Systolic function was normal.   The estimated ejection fraction was in the range of 55% to 60%.   Wall motion was normal; there were no regional wall motion   abnormalities. Doppler parameters are consistent with abnormal   left ventricular relaxation (grade 1 diastolic dysfunction). The   E/e&' ratio is between 8-15, suggesting indeterminate LV filling   pressure. - Mitral valve: Mildly thickened leaflets . There was trivial   regurgitation. - Left atrium: The atrium was normal in size. - Atrial septum: H/o ASD repair. Bubble study negative for   interatrial shunt. - Tricuspid valve: There was trivial regurgitation. - Pulmonary arteries: PA peak pressure: 23 mm Hg (S). - Inferior vena cava: The vessel was normal in size. The   respirophasic diameter changes were in the normal range (>= 50%),   consistent with normal central venous pressure. - Pericardium, extracardiac: A trivial pericardial effusion was   identified. Features were not consistent with tamponade   physiology.  Impressions: - Compared to a prior study in  2015, the ASD repair remains intact.   LVEF is unchanged at 55-60%. A trivial pericardial effusion was   noted.  ECG - formed today 12/14/2018 was personally reviewed and shows normal sinus rhythm, incomplete right bundle branch block this is unchanged from prior.     ASSESSMENT:    1. Other hyperlipidemia   2. History of repair of congenital atrial septal defect (ASD)      PLAN:  In order of problems listed above:  1. ASD repair in 2010, doing well, asymptomatic. Echocardiogram performed in 2019 showing most residual ASD, negative bubble study LVEF improved to 55 to 60%.  From now on with no need to repeat echo unless new symptoms.   2. Hyperlipidemia - the patient is clear about not wanting to take statins that were also advised by her PCP, she realizes that she needs to change her diet with regards to eating sweets.  She is advised to exercise regularly.  3. Incomplete right bundle branch block noticed on EKG at the last visit, unchanged today, we'll repeat echocardiogram.   Medication Adjustments/Labs and Tests Ordered: Current medicines are reviewed at length with the patient today.  Concerns regarding medicines are outlined above.  Medication changes, Labs and Tests ordered today are listed in the Patient Instructions below. Patient Instructions  Medication Instructions:   Your physician recommends that you continue on your current medications as directed. Please refer to the Current Medication list given to you today.  If you need a refill on your cardiac medications before your next  appointment, please call your pharmacy.     Follow-Up: At [facility], you and your health needs are our priority.  As part of our continuing mission to provide you with exceptional heart care, we have created designated Provider Care Teams.  These Care Teams include your primary Cardiologist (physician) and Advanced Practice Providers (APPs -  Physician Assistants and Nurse Practitioners)  who all work together to provide you with the care you need, when you need it. You will need a follow up appointment in 2 years.  Please call our office 2 months in advance to schedule this appointment.  You may see Tobias AlexanderKatarina Ajdin Macke, MD or one of the following Advanced Practice Providers on your designated Care Team:   [place]ittainy Simmons, PA-C Ronie Spiesayna Dunn, PA-C . Jacolyn ReedyMichele Lenze, PA-C        Signed, Tobias AlexanderKatarina Maydell Knoebel, MD  12/14/2018 12:14 PM    [facility] [address], [place], [place]  [address] Phone: [phone]; Fax: [phone]

## 2018-12-14 NOTE — Patient Instructions (Signed)
Medication Instructions:   Your physician recommends that you continue on your current medications as directed. Please refer to the Current Medication list given to you today.  If you need a refill on your cardiac medications before your next appointment, please call your pharmacy.     Follow-Up: At [facility], you and your health needs are our priority.  As part of our continuing mission to provide you with exceptional heart care, we have created designated Provider Care Teams.  These Care Teams include your primary Cardiologist (physician) and Advanced Practice Providers (APPs -  Physician Assistants and Nurse Practitioners) who all work together to provide you with the care you need, when you need it. You will need a follow up appointment in 2 years.  Please call our office 2 months in advance to schedule this appointment.  You may see Tobias Alexander, MD or one of the following Advanced Practice Providers on your designated Care Team:   [place], PA-C Ronie Spies, PA-C . Jacolyn Reedy, PA-C

## 2019-05-10 ENCOUNTER — Emergency Department (HOSPITAL_COMMUNITY): Payer: Medicare HMO

## 2019-05-10 ENCOUNTER — Other Ambulatory Visit: Payer: Self-pay

## 2019-05-10 ENCOUNTER — Emergency Department (HOSPITAL_COMMUNITY)
Admission: EM | Admit: 2019-05-10 | Discharge: 2019-05-10 | Disposition: A | Discharge status: Home / Self Care | Payer: Medicare HMO | Attending: Emergency Medicine | Admitting: Emergency Medicine

## 2019-05-10 ENCOUNTER — Encounter (HOSPITAL_COMMUNITY): Payer: Self-pay | Admitting: Emergency Medicine

## 2019-05-10 DIAGNOSIS — Z20822 Contact with and (suspected) exposure to covid-19: Secondary | ICD-10-CM

## 2019-05-10 DIAGNOSIS — Z87891 Personal history of nicotine dependence: Secondary | ICD-10-CM | POA: Diagnosis not present

## 2019-05-10 DIAGNOSIS — I4519 Other right bundle-branch block: Secondary | ICD-10-CM | POA: Diagnosis not present

## 2019-05-10 DIAGNOSIS — I270 Primary pulmonary hypertension: Secondary | ICD-10-CM | POA: Insufficient documentation

## 2019-05-10 DIAGNOSIS — Z79899 Other long term (current) drug therapy: Secondary | ICD-10-CM | POA: Diagnosis not present

## 2019-05-10 DIAGNOSIS — J441 Chronic obstructive pulmonary disease with (acute) exacerbation: Secondary | ICD-10-CM

## 2019-05-10 DIAGNOSIS — Z882 Allergy status to sulfonamides status: Secondary | ICD-10-CM | POA: Diagnosis not present

## 2019-05-10 DIAGNOSIS — Z20828 Contact with and (suspected) exposure to other viral communicable diseases: Secondary | ICD-10-CM | POA: Insufficient documentation

## 2019-05-10 DIAGNOSIS — Z888 Allergy status to other drugs, medicaments and biological substances status: Secondary | ICD-10-CM | POA: Diagnosis not present

## 2019-05-10 DIAGNOSIS — R0602 Shortness of breath: Secondary | ICD-10-CM | POA: Diagnosis present

## 2019-05-10 DIAGNOSIS — J40 Bronchitis, not specified as acute or chronic: Secondary | ICD-10-CM

## 2019-05-10 DIAGNOSIS — Z881 Allergy status to other antibiotic agents status: Secondary | ICD-10-CM | POA: Diagnosis not present

## 2019-05-10 LAB — CBC WITH DIFFERENTIAL/PLATELET
Abs Immature Granulocytes: 0.02 10*3/uL (ref 0.00–0.07)
Basophils Absolute: 0.1 10*3/uL (ref 0.0–0.1)
Basophils Relative: 1 %
Eosinophils Absolute: 0.4 10*3/uL (ref 0.0–0.5)
Eosinophils Relative: 6 %
HCT: 47.2 % — ABNORMAL HIGH (ref 36.0–46.0)
Hemoglobin: 14.7 g/dL (ref 12.0–15.0)
Immature Granulocytes: 0 %
Lymphocytes Relative: 31 %
Lymphs Abs: 2.2 10*3/uL (ref 0.7–4.0)
MCH: 29.1 pg (ref 26.0–34.0)
MCHC: 31.1 g/dL (ref 30.0–36.0)
MCV: 93.5 fL (ref 80.0–100.0)
Monocytes Absolute: 0.6 10*3/uL (ref 0.1–1.0)
Monocytes Relative: 8 %
Neutro Abs: 3.8 10*3/uL (ref 1.7–7.7)
Neutrophils Relative %: 54 %
Platelets: 163 10*3/uL (ref 150–400)
RBC: 5.05 MIL/uL (ref 3.87–5.11)
RDW: 12.7 % (ref 11.5–15.5)
WBC: 7.1 10*3/uL (ref 4.0–10.5)
nRBC: 0 % (ref 0.0–0.2)

## 2019-05-10 LAB — BASIC METABOLIC PANEL
Anion gap: 12 (ref 5–15)
BUN: 12 mg/dL (ref 8–23)
CO2: 26 mmol/L (ref 22–32)
Calcium: 9.8 mg/dL (ref 8.9–10.3)
Chloride: 103 mmol/L (ref 98–111)
Creatinine, Ser: 0.59 mg/dL (ref 0.44–1.00)
GFR calc Af Amer: >60 mL/min (ref >60)
GFR calc non Af Amer: >60 mL/min (ref >60)
Glucose, Bld: 99 mg/dL (ref 70–99)
Potassium: 4.4 mmol/L (ref 3.5–5.1)
Sodium: 141 mmol/L (ref 135–145)

## 2019-05-10 LAB — SARS CORONAVIRUS 2 BY RT PCR (HOSPITAL ORDER, PERFORMED IN ~~LOC~~ HOSPITAL LAB): SARS Coronavirus 2: NEGATIVE

## 2019-05-10 MED ORDER — METHYLPREDNISOLONE SODIUM SUCC 125 MG IJ SOLR
125.0000 mg | Freq: Once | INTRAMUSCULAR | Status: AC
  Administered 2019-05-10: 125 mg via INTRAVENOUS
  Filled 2019-05-10: qty 2

## 2019-05-10 MED ORDER — PREDNISONE 10 MG (21) PO TBPK: ORAL_TABLET | ORAL | 0 refills | Status: DC

## 2019-05-10 MED ORDER — AZITHROMYCIN 200 MG/5ML PO SUSR
500.0000 mg | Freq: Once | ORAL | Status: AC
  Administered 2019-05-10: 500 mg via ORAL
  Filled 2019-05-10: qty 15

## 2019-05-10 MED ORDER — MAGNESIUM SULFATE 2 GM/50ML IV SOLN
2.0000 g | Freq: Once | INTRAVENOUS | Status: AC
  Administered 2019-05-10: 2 g via INTRAVENOUS
  Filled 2019-05-10: qty 50

## 2019-05-10 MED ORDER — AEROCHAMBER PLUS FLO-VU MEDIUM MISC
1.0000 | Freq: Once | Status: AC
  Administered 2019-05-10: 1
  Filled 2019-05-10 (×2): qty 1

## 2019-05-10 MED ORDER — ALBUTEROL SULFATE HFA 108 (90 BASE) MCG/ACT IN AERS
1.0000 | INHALATION_SPRAY | Freq: Once | RESPIRATORY_TRACT | Status: AC
  Administered 2019-05-10: 2 via RESPIRATORY_TRACT
  Filled 2019-05-10: qty 6.7

## 2019-05-10 MED ORDER — AZITHROMYCIN 100 MG/5ML PO SUSR: 250.0000 mg | Freq: Every day | ORAL | 0 refills | Status: AC

## 2019-05-10 NOTE — ED Provider Notes (Signed)
[facility] EMERGENCY DEPARTMENT Provider Note   CSN: [id number] Arrival date & time: 05/10/19  1432    History   Chief Complaint Chief Complaint  Patient presents with  . Shortness of Breath    x 2 weeks    HPI [name] [name] is a 68 y.o. female.     Pt presents to the ED today with sob.  The pt has a hx of COPD and has been using her inhaler and her nebs, but they have not been helping.  Pt has been under a lot of stress as her husband (wage earner) has been out of work due to [employer]ov[employer].  She is also worried that she could get Covid, but has no known exposures.  She denies f/c.     Past Medical History:  Diagnosis Date  . Atrial septal defect   . COPD (chronic obstructive pulmonary disease) (HCC)   . Dyspnea   . Hyperlipidemia   . Hypertension    pulmonary  . Kyphoscoliosis    thoracic spine    Patient Active Problem List   Diagnosis Date Noted  . Incomplete right bundle branch block 08/12/2016  . Hyperlipidemia 08/10/2015  . TACHYCARDIA 12/29/2009  . HYPERTENSION, PULMONARY 04/01/2009  . COPD 04/01/2009  . KYPHOSCOLIOSIS, THORACIC SPINE 04/01/2009  . ATRIAL SEPTAL DEFECT, SECUNDUM TYPE 04/01/2009  . DOE (dyspnea on exertion) 04/01/2009    Past Surgical History:  Procedure Laterality Date  . CARDIAC SURGERY    . TONSILLECTOMY       OB History   No obstetric history on file.      Home Medications    Prior to Admission medications   Medication Sig Start Date End Date Taking? Authorizing Provider  albuterol (PROVENTIL) (2.5 MG/3ML) 0.083% nebulizer solution Take 2.5 mg by nebulization every 6 (six) hours as needed for wheezing or shortness of breath.  04/30/19  Yes [provider]  ALPRAZolam (XANAX) 0.5 MG tablet Take 0.25-0.5 mg by mouth at bedtime as needed for anxiety. *May take up to 4 times daily as needed for anxiety   Yes [provider]  Cholecalciferol (VITAMIN D) 50 MCG (2000 UT) tablet Take 2,000 Units by mouth daily.   Yes  [provider]  SYNTHROID 50 MCG tablet TAKE ONE TABLET BY MOUTH DAILY. Patient taking differently: Take 50 mcg by mouth daily before breakfast.  09/08/16  Yes Eure, Amaryllis DykeLuther H, MD  VENTOLIN HFA 108 (90 BASE) MCG/ACT inhaler Inhale 1-2 puffs into the lungs every 6 (six) hours as needed for wheezing or shortness of breath.  11/16/13  Yes [provider]  azithromycin (ZITHROMAX) 100 MG/5ML suspension Take 12.5 mLs (250 mg total) by mouth daily for 4 days. 05/11/19 05/15/19  Jacalyn LefevreHaviland, Neli Fofana, MD  predniSONE (STERAPRED UNI-PAK 21 TAB) 10 MG (21) TBPK tablet Take 6 tabs for 2 days, then 5 for 2 days, then 4 for 2 days, then 3 for 2 days, 2 for 2 days, then 1 for 2 days 05/10/19   Jacalyn LefevreHaviland, Mei Suits, MD    Family History Family History  Problem Relation Age of Onset  . Lung cancer Paternal Grandfather   . Breast cancer Paternal Grandmother   . Colon cancer Father   . Stroke Mother   . Hypertension Mother   . Pancreatic cancer Maternal Aunt     Social History Social History   Tobacco Use  . Smoking status: Former Games developermoker  . Smokeless tobacco: Never Used  Substance Use Topics  . Alcohol use: Yes  Comment: occasional  . Drug use: No     Allergies   Levofloxacin; Sulfonamide derivatives; and Moxifloxacin   Review of Systems Review of Systems  Respiratory: Positive for cough, shortness of breath and wheezing.   All other systems reviewed and are negative.    Physical Exam Updated Vital Signs BP (!) 158/80   Pulse 99   Temp 98.4 F (36.9 C)   Resp 17   Ht 5\' 1"  (1.549 m)   Wt 51.7 kg   SpO2 99%   BMI 21.54 kg/m   Physical Exam Vitals signs and nursing note reviewed.  Constitutional:      Appearance: She is well-developed.  HENT:     Head: Normocephalic and atraumatic.     Mouth/Throat:     Mouth: Mucous membranes are moist.     Pharynx: Oropharynx is clear.  Eyes:     Extraocular Movements: Extraocular movements intact.     Pupils: Pupils are equal,  round, and reactive to light.  Neck:     Musculoskeletal: Normal range of motion and neck supple.  Cardiovascular:     Rate and Rhythm: Regular rhythm. Tachycardia present.  Pulmonary:     Effort: Tachypnea present.     Breath sounds: Wheezing present.  Abdominal:     General: Bowel sounds are normal.     Palpations: Abdomen is soft.  Musculoskeletal: Normal range of motion.  Skin:    General: Skin is warm.     Capillary Refill: Capillary refill takes less than 2 seconds.  Neurological:     General: No focal deficit present.     Mental Status: She is alert and oriented to person, place, and time.  Psychiatric:        Mood and Affect: Mood normal.        Behavior: Behavior normal.      ED Treatments / Results  Labs (all labs ordered are listed, but only abnormal results are displayed) Labs Reviewed  CBC WITH DIFFERENTIAL/PLATELET - Abnormal; Notable for the following components:      Result Value   HCT 47.2 (*)    All other components within normal limits  SARS CORONAVIRUS 2 (HOSPITAL ORDER, PERFORMED IN [place] [facility])  BASIC METABOLIC PANEL    EKG None  Radiology Dg Chest 2 View  Result Date: 05/10/2019 CLINICAL DATA:  Wheezing and sob x 2 weeks. Hx of asthma and copd. Ex smoker. EXAM: CHEST - 2 VIEW COMPARISON:  11/19/2018 FINDINGS: Lungs are clear. Heart size and mediastinal contours are within normal limits. No effusion. Thorax dextroscoliosis with resultant deformity of the thoracic cage as before. Mild spondylitic changes in the visualized upper lumbar spine. No acute bone abnormality. IMPRESSION: No acute cardiopulmonary disease. Electronically Signed   By: Corlis Leak  Hassell M.D.   On: 05/10/2019 15:15    Procedures Procedures (including critical care time)  Medications Ordered in ED Medications  azithromycin (ZITHROMAX) 200 MG/5ML suspension 500 mg (has no administration in time range)  albuterol (VENTOLIN HFA) 108 (90 Base) MCG/ACT inhaler 1-2 puff (2  puffs Inhalation Given 05/10/19 1821)  AeroChamber Plus Flo-Vu Medium MISC 1 each (1 each Other Given 05/10/19 1821)  methylPREDNISolone sodium succinate (SOLU-MEDROL) 125 mg/2 mL injection 125 mg (125 mg Intravenous Given 05/10/19 2023)  magnesium sulfate IVPB 2 g 50 mL (0 g Intravenous Stopped 05/10/19 2142)     Initial Impression / Assessment and Plan / ED Course  I have reviewed the triage vital signs and the nursing notes.  Pertinent labs & imaging results that were available during my care of the patient were reviewed by me and considered in my medical decision making (see chart for details).      Pt is feeling much better after treatment.  She is negative for covid.  She knows to return if worse.  F/u with pcp.  Cleotis Lema was evaluated in Emergency Department on 05/10/2019 for the symptoms described in the history of present illness. She was evaluated in the context of the global COVID-19 pandemic, which necessitated consideration that the patient might be at risk for infection with the SARS-CoV-2 virus that causes COVID-19. Institutional protocols and algorithms that pertain to the evaluation of patients at risk for COVID-19 are in a state of rapid change based on information released by regulatory bodies including the CDC and federal and state organizations. These policies and algorithms were followed during the patient's care in the ED.  Final Clinical Impressions(s) / ED Diagnoses   Final diagnoses:  COPD exacerbation ([place])  Bronchitis  Covid-19 Virus not Detected    ED Discharge Orders         Ordered    azithromycin (ZITHROMAX) 100 MG/5ML suspension  Daily     05/10/19 2135    predniSONE (STERAPRED UNI-PAK 21 TAB) 10 MG (21) TBPK tablet     05/10/19 2135           Isla Pence, MD 05/10/19 2142

## 2019-05-10 NOTE — ED Triage Notes (Signed)
Pt has concern regarding her blood pressure and  Her wheezing for the last several weeks  She reports she has spoken with her PCP who suggests it is pollen  Here today for eval as she continues to wheeze

## 2020-11-15 ENCOUNTER — Ambulatory Visit: Payer: Medicare HMO | Admitting: Cardiology

## 2020-12-04 ENCOUNTER — Ambulatory Visit: Payer: Medicare HMO | Admitting: Physician Assistant

## 2021-02-06 ENCOUNTER — Encounter: Payer: Self-pay | Admitting: Physician Assistant

## 2021-02-06 ENCOUNTER — Ambulatory Visit (INDEPENDENT_AMBULATORY_CARE_PROVIDER_SITE_OTHER): Payer: Medicare Other | Admitting: Physician Assistant

## 2021-02-06 ENCOUNTER — Other Ambulatory Visit: Payer: Self-pay

## 2021-02-06 VITALS — BP 130/86 | HR 90 | Ht 61.0 in | Wt 117.4 lb

## 2021-02-06 DIAGNOSIS — Z8774 Personal history of (corrected) congenital malformations of heart and circulatory system: Secondary | ICD-10-CM | POA: Diagnosis not present

## 2021-02-06 DIAGNOSIS — I4510 Unspecified right bundle-branch block: Secondary | ICD-10-CM | POA: Diagnosis not present

## 2021-02-06 DIAGNOSIS — R000 Tachycardia, unspecified: Secondary | ICD-10-CM | POA: Diagnosis not present

## 2021-02-06 DIAGNOSIS — E782 Mixed hyperlipidemia: Secondary | ICD-10-CM

## 2021-02-06 DIAGNOSIS — R002 Palpitations: Secondary | ICD-10-CM

## 2021-02-06 NOTE — Progress Notes (Signed)
Cardiology Office Note:    Date:  02/06/2021   ID:  [name] [name], DOB 04/20/1951, MRN [id number]  PCP:  Gareth Morgan, MD  [facility] Cardiologist:  Tobias Alexander, MD  [facility] Electrophysiologist:  None   Chief Complaint: 2 yr follow up   History of Present Illness:    [name] [name] is a 69 y.o. female with a hx of ASD repair in 2010, HLD and IRBBB seen for follow up.   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 She reports improvement of DOE after surgery.  She was on metoprolol for few years after her surgery and then discontinued. Patient had normal GXT for chest pain 2015. She has refused to take a statin.  Last seen by Dr. Delton See 12/2018.  Here today for follow up.  Patient denies any exertional chest pain or pressure.  Patient has noted palpitation under extreme stress.  Never checked her pulse or blood pressure.  Symptoms last for 1 to 2 minutes with self resolution.  No associated chest discomfort or shortness of breath.   Past Medical History:  Diagnosis Date  . Atrial septal defect   . COPD (chronic obstructive pulmonary disease) (HCC)   . Dyspnea   . Hyperlipidemia   . Hypertension    pulmonary  . Kyphoscoliosis    thoracic spine    Past Surgical History:  Procedure Laterality Date  . CARDIAC SURGERY    . TONSILLECTOMY      Current Medications: Current Meds  Medication Sig  . albuterol (PROVENTIL) (2.5 MG/3ML) 0.083% nebulizer solution Take 2.5 mg by nebulization every 6 (six) hours as needed for wheezing or shortness of breath.   . ALPRAZolam (XANAX) 0.5 MG tablet Take 0.25-0.5 mg by mouth at bedtime as needed for anxiety. *May take up to 4 times daily as needed for anxiety  . Cholecalciferol (VITAMIN D) 50 MCG (2000 UT) tablet Take 2,000 Units by mouth daily.  Marland Kitchen SYNTHROID 50 MCG tablet TAKE ONE TABLET BY MOUTH DAILY. (Patient taking differently: Take 50 mcg by mouth daily before breakfast.)  . VENTOLIN HFA 108 (90  BASE) MCG/ACT inhaler Inhale 1-2 puffs into the lungs every 6 (six) hours as needed for wheezing or shortness of breath.      Allergies:   Levofloxacin, Sulfonamide derivatives, and Moxifloxacin   Social History   Socioeconomic History  . Marital status: Married    Spouse name: Not on file  . Number of children: Not on file  . Years of education: Not on file  . Highest education level: Not on file  Occupational History  . Not on file  Tobacco Use  . Smoking status: Former Games developer  . Smokeless tobacco: Never Used  Vaping Use  . Vaping Use: Never used  Substance and Sexual Activity  . Alcohol use: Yes    Comment: occasional  . Drug use: No  . Sexual activity: Not on file  Other Topics Concern  . Not on file  Social History Narrative  . Not on file   Social Determinants of Health   Financial Resource Strain: Not on file  Food Insecurity: Not on file  Transportation Needs: Not on file  Physical Activity: Not on file  Stress: Not on file  Social Connections: Not on file     Family History: The patient's family history includes Breast cancer in her paternal grandmother; Colon cancer in her father; Hypertension in her mother; Lung cancer in her paternal grandfather; Pancreatic cancer  in her maternal aunt; Stroke in her mother.    ROS:   Please see the history of present illness.    All other systems reviewed and are negative.   EKGs/Labs/Other Studies Reviewed:    The following studies were reviewed today:  Echo January 2019  - Left ventricle: The cavity size was normal. Wall thickness was increased in a pattern of mild LVH. Systolic function was normal. The estimated ejection fraction was in the range of 55% to 60%. Wall motion was normal; there were no regional wall motion abnormalities. Doppler parameters are consistent with abnormal left ventricular relaxation (grade 1 diastolic dysfunction). The E/e&' ratio is between 8-15, suggesting indeterminate  LV filling pressure. - Mitral valve: Mildly thickened leaflets . There was trivial regurgitation. - Left atrium: The atrium was normal in size. - Atrial septum: H/o ASD repair. Bubble study negative for interatrial shunt. - Tricuspid valve: There was trivial regurgitation. - Pulmonary arteries: PA peak pressure: 23 mm Hg (S). - Inferior vena cava: The vessel was normal in size. The respirophasic diameter changes were in the normal range (>= 50%), consistent with normal central venous pressure. - Pericardium, extracardiac: A trivial pericardial effusion was identified. Features were not consistent with tamponade physiology.  Impressions: - Compared to a prior study in 2015, the ASD repair remains intact. LVEF is unchanged at 55-60%. A trivial pericardial effusion was noted.  ECG- formed today 12/14/2018 was personally reviewed and shows normal sinus rhythm, incomplete right bundle branch block this is unchanged from prior.    EKG:  EKG is ordered today.  The ekg ordered today demonstrates normal sinus rhythm  Recent Labs: No results found for requested labs within last 8760 hours.  Recent Lipid Panel    Component Value Date/Time   CHOL  11/09/2009 0455    114        ATP III CLASSIFICATION:  <200     mg/dL   Desirable  009-381  mg/dL   Borderline High  >=829    mg/dL   High          TRIG 54 11/09/2009 0455   HDL 38 (L) 11/09/2009 0455   CHOLHDL 3.0 11/09/2009 0455   VLDL 11 11/09/2009 0455   LDLCALC  11/09/2009 0455    65        Total Cholesterol/HDL:CHD Risk Coronary Heart Disease Risk Table                     Men   Women  1/2 Average Risk   3.4   3.3  Average Risk       5.0   4.4  2 X Average Risk   9.6   7.1  3 X Average Risk  23.4   11.0        Use the calculated Patient Ratio above and the CHD Risk Table to determine the patient's CHD Risk.        ATP III CLASSIFICATION (LDL):  <100     mg/dL   Optimal  937-169  mg/dL   Near or  Above                    Optimal  130-159  mg/dL   Borderline  678-938  mg/dL   High  >101     mg/dL   Very High    Physical Exam:    VS:  BP 130/86   Pulse 90   Ht 5\' 1"  (1.549 m)   Wt  117 lb 6.4 oz (53.3 kg)   SpO2 96%   BMI 22.18 kg/m     Wt Readings from Last 3 Encounters:  02/06/21 117 lb 6.4 oz (53.3 kg)  05/10/19 114 lb (51.7 kg)  12/14/18 123 lb 3.2 oz (55.9 kg)     GEN: Well nourished, well developed in no acute distress HEENT: Normal NECK: No JVD; No carotid bruits LYMPHATICS: No lymphadenopathy CARDIAC: RRR, no murmurs, rubs, gallops RESPIRATORY:  Clear to auscultation without rales, wheezing or rhonchi  ABDOMEN: Soft, non-tender, non-distended MUSCULOSKELETAL:  No edema; No deformity  SKIN: Warm and dry NEUROLOGIC:  Alert and oriented x 3 PSYCHIATRIC:  Normal affect   ASSESSMENT AND PLAN:    1. S/p ASD repair No interatrial shunt.echocardiogram in 2019.  2. Palpitations Seems stress/emotional related.  Advised to check vitals during episodes.  If persistent tachycardia she will give [place] a call or go to ER.  Medication Adjustments/Labs and Tests Ordered: Current medicines are reviewed at length with the patient today.  Concerns regarding medicines are outlined above.  Orders Placed This Encounter  Procedures  . EKG 12-Lead   No orders of the defined types were placed in this encounter.   Patient Instructions  Medication Instructions:  Your physician recommends that you continue on your current medications as directed. Please refer to the Current Medication list given to you today.  *If you need a refill on your cardiac medications before your next appointment, please call your pharmacy*   Lab Work: None ordered  If you have labs (blood work) drawn today and your tests are completely normal, you will receive your results only by: Marland Kitchen MyChart Message (if you have MyChart) OR . A paper copy in the mail If you have any lab test that is abnormal or  we need to change your treatment, we will call you to review the results.   Testing/Procedures: None ordered   Follow-Up: At [facility], you and your health needs are our priority.  As part of our continuing mission to provide you with exceptional heart care, we have created designated Provider Care Teams.  These Care Teams include your primary Cardiologist (physician) and Advanced Practice Providers (APPs -  Physician Assistants and Nurse Practitioners) who all work together to provide you with the care you need, when you need it.  We recommend signing up for the patient portal called "MyChart".  Sign up information is provided on this After Visit Summary.  MyChart is used to connect with patients for Virtual Visits (Telemedicine).  Patients are able to view lab/test results, encounter notes, upcoming appointments, etc.  Non-urgent messages can be sent to your provider as well.   To learn more about what you can do with MyChart, go to [email or web address].    Your next appointment:   12 month(s)  The format for your next appointment:   In Person  Provider:   Dina Rich, MD   Other Instructions       Signed, Manson Passey, PA  02/06/2021 10:28 AM    [facility]

## 2021-02-06 NOTE — Patient Instructions (Addendum)
Medication Instructions:  Your physician recommends that you continue on your current medications as directed. Please refer to the Current Medication list given to you today.  *If you need a refill on your cardiac medications before your next appointment, please call your pharmacy*   Lab Work: None ordered  If you have labs (blood work) drawn today and your tests are completely normal, you will receive your results only by: . MyChart Message (if you have MyChart) OR . A paper copy in the mail If you have any lab test that is abnormal or we need to change your treatment, we will call you to review the results.   Testing/Procedures: None ordered   Follow-Up: At [facility], you and your health needs are our priority.  As part of our continuing mission to provide you with exceptional heart care, we have created designated Provider Care Teams.  These Care Teams include your primary Cardiologist (physician) and Advanced Practice Providers (APPs -  Physician Assistants and Nurse Practitioners) who all work together to provide you with the care you need, when you need it.  We recommend signing up for the patient portal called "MyChart".  Sign up information is provided on this After Visit Summary.  MyChart is used to connect with patients for Virtual Visits (Telemedicine).  Patients are able to view lab/test results, encounter notes, upcoming appointments, etc.  Non-urgent messages can be sent to your provider as well.   To learn more about what you can do with MyChart, go to [email or web address].    Your next appointment:   12 month(s)  The format for your next appointment:   In Person  Provider:   Jonathan Branch, MD   Other Instructions   

## 2021-03-20 ENCOUNTER — Other Ambulatory Visit: Payer: Self-pay

## 2021-03-20 ENCOUNTER — Ambulatory Visit
Admission: EM | Admit: 2021-03-20 | Discharge: 2021-03-20 | Disposition: A | Discharge status: Home / Self Care | Payer: Medicare Other | Attending: Family Medicine | Admitting: Family Medicine

## 2021-03-20 ENCOUNTER — Encounter: Payer: Self-pay | Admitting: Emergency Medicine

## 2021-03-20 DIAGNOSIS — J209 Acute bronchitis, unspecified: Secondary | ICD-10-CM | POA: Diagnosis not present

## 2021-03-20 MED ORDER — PREDNISONE 10 MG (21) PO TBPK: ORAL_TABLET | Freq: Every day | ORAL | 0 refills | Status: DC

## 2021-03-20 MED ORDER — PREDNISONE 5 MG/5ML PO SOLN: 30.0000 mg | Freq: Every day | ORAL | 0 refills | Status: AC

## 2021-03-20 NOTE — ED Provider Notes (Signed)
[facility]   [id number] 03/20/21 Arrival Time: 0917   SUBJECTIVE:  [name] [name] is a 70 y.o. female who presents with complaint of nasal congestion, post-nasal drainage, and a persistent cough. Onset abrupt approximately 2 weeks ago. Overall fatigued. SOB: mild. Wheezing: moderate. Has positive history of Covid. Has completed Covid vaccines. OTC treatment: as used nebulizer. States that she is a previous smoker. Social History   Tobacco Use  Smoking Status Former Smoker  Smokeless Tobacco Never Used    ROS: As per HPI.   OBJECTIVE:  Vitals:   03/20/21 0943  BP: (!) 152/73  Pulse: 83  Resp: 18  Temp: (!) 97.5 F (36.4 C)  TempSrc: Oral  SpO2: 96%     General appearance: alert; no distress HEENT: nasal congestion; clear runny nose; throat irritation secondary to post-nasal drainage Neck: supple with LAD Lungs: diffuse wheezing to bilateral lung fields Skin: warm and dry Psychological: alert and cooperative; normal mood and affect  Results for orders placed or performed during the hospital encounter of 05/10/19  SARS Coronavirus 2 (CEPHEID - Performed in [facility] hospital lab), [facility] Order   Specimen: Nasopharyngeal Swab  Result Value Ref Range   SARS Coronavirus 2 NEGATIVE NEGATIVE  Basic metabolic panel  Result Value Ref Range   Sodium 141 135 - 145 mmol/L   Potassium 4.4 3.5 - 5.1 mmol/L   Chloride 103 98 - 111 mmol/L   CO2 26 22 - 32 mmol/L   Glucose, Bld 99 70 - 99 mg/dL   BUN 12 8 - 23 mg/dL   Creatinine, Ser 3.61 0.44 - 1.00 mg/dL   Calcium 9.8 8.9 - 44.3 mg/dL   GFR calc non Af Amer >60 >60 mL/min   GFR calc Af Amer >60 >60 mL/min   Anion gap 12 5 - 15  CBC with Differential  Result Value Ref Range   WBC 7.1 4.0 - 10.5 K/uL   RBC 5.05 3.87 - 5.11 MIL/uL   Hemoglobin 14.7 12.0 - 15.0 g/dL   HCT 15.4 (H) 00.8 - 67.6 %   MCV 93.5 80.0 - 100.0 fL   MCH 29.1 26.0 - 34.0 pg   MCHC 31.1 30.0 - 36.0 g/dL   RDW 19.5 09.3 - 26.7 %    Platelets 163 150 - 400 K/uL   nRBC 0.0 0.0 - 0.2 %   Neutrophils Relative % 54 %   Neutro Abs 3.8 1.7 - 7.7 K/uL   Lymphocytes Relative 31 %   Lymphs Abs 2.2 0.7 - 4.0 K/uL   Monocytes Relative 8 %   Monocytes Absolute 0.6 0.1 - 1.0 K/uL   Eosinophils Relative 6 %   Eosinophils Absolute 0.4 0.0 - 0.5 K/uL   Basophils Relative 1 %   Basophils Absolute 0.1 0.0 - 0.1 K/uL   Immature Granulocytes 0 %   Abs Immature Granulocytes 0.02 0.00 - 0.07 K/uL    Labs Reviewed - No data to display  No results found.  Allergies  Allergen Reactions  . Levofloxacin Shortness Of Breath  . Sulfonamide Derivatives Shortness Of Breath  . Moxifloxacin Other (See Comments)    hallucinations    Past Medical History:  Diagnosis Date  . Atrial septal defect   . COPD (chronic obstructive pulmonary disease) (HCC)   . Dyspnea   . Hyperlipidemia   . Hypertension    pulmonary  . Kyphoscoliosis    thoracic spine   Social History   Socioeconomic History  . Marital status: Married  Spouse name: Not on file  . Number of children: Not on file  . Years of education: Not on file  . Highest education level: Not on file  Occupational History  . Not on file  Tobacco Use  . Smoking status: Former Games developer  . Smokeless tobacco: Never Used  Vaping Use  . Vaping Use: Never used  Substance and Sexual Activity  . Alcohol use: Yes    Comment: occasional  . Drug use: No  . Sexual activity: Not on file  Other Topics Concern  . Not on file  Social History Narrative  . Not on file   Social Determinants of Health   Financial Resource Strain: Not on file  Food Insecurity: Not on file  Transportation Needs: Not on file  Physical Activity: Not on file  Stress: Not on file  Social Connections: Not on file  Intimate Partner Violence: Not on file   Family History  Problem Relation Age of Onset  . Lung cancer Paternal Grandfather   . Breast cancer Paternal Grandmother   . Colon cancer Father   .  Stroke Mother   . Hypertension Mother   . Pancreatic cancer Maternal Aunt      ASSESSMENT & PLAN:  1. Acute bronchitis, unspecified organism     Meds ordered this encounter  Medications  . DISCONTD: predniSONE (STERAPRED UNI-PAK 21 TAB) 10 MG (21) TBPK tablet    Sig: Take by mouth daily for 6 days. Take 6 tablets on day 1, 5 tablets on day 2, 4 tablets on day 3, 3 tablets on day 4, 2 tablets on day 5, 1 tablet on day 6    Dispense:  21 tablet    Refill:  0    Order Specific Question:   Supervising Provider    Answer:   Merrilee Jansky [id number]  . predniSONE 5 MG/5ML solution    Sig: Take 30 mLs (30 mg total) by mouth daily with breakfast for 5 days.    Dispense:  240 mL    Refill:  0    Order Specific Question:   Supervising Provider    Answer:   Merrilee Jansky [id number]   Prednisone taper prescribed Continue with nebulizers as needed Will treat with above therapy given duration of symptoms.  Cough medication sedation precautions.  OTC symptom care as needed. Will plan f/u with PCP or here as needed.  Reviewed expectations re: course of current medical issues. Questions answered. Outlined signs and symptoms indicating need for more acute intervention. Patient verbalized understanding. After Visit Summary given.           Moshe Cipro, NP 03/21/21 [phone]

## 2021-03-20 NOTE — ED Triage Notes (Signed)
Wheezing x 2-3 weeks with no cough

## 2021-03-20 NOTE — Discharge Instructions (Addendum)
I have sent in prednisone for you to take 30 mL daily for 5 days  Follow up with this office or with primary care if symptoms are persisting.  Follow up in the ER for high fever, trouble swallowing, trouble breathing, other concerning symptoms.

## 2021-08-29 ENCOUNTER — Ambulatory Visit (INDEPENDENT_AMBULATORY_CARE_PROVIDER_SITE_OTHER): Payer: Medicare Other

## 2021-08-29 ENCOUNTER — Ambulatory Visit
Admission: EM | Admit: 2021-08-29 | Discharge: 2021-08-29 | Disposition: A | Discharge status: Home / Self Care | Payer: Medicare Other | Attending: Physician Assistant | Admitting: Physician Assistant

## 2021-08-29 ENCOUNTER — Other Ambulatory Visit: Payer: Self-pay

## 2021-08-29 ENCOUNTER — Encounter: Payer: Self-pay | Admitting: Emergency Medicine

## 2021-08-29 DIAGNOSIS — J209 Acute bronchitis, unspecified: Secondary | ICD-10-CM | POA: Diagnosis not present

## 2021-08-29 DIAGNOSIS — R062 Wheezing: Secondary | ICD-10-CM | POA: Diagnosis not present

## 2021-08-29 DIAGNOSIS — R0602 Shortness of breath: Secondary | ICD-10-CM | POA: Diagnosis not present

## 2021-08-29 DIAGNOSIS — R059 Cough, unspecified: Secondary | ICD-10-CM | POA: Diagnosis not present

## 2021-08-29 MED ORDER — AMOXICILLIN 400 MG/5ML PO SUSR: ORAL | 0 refills | Status: DC

## 2021-08-29 MED ORDER — DOXYCYCLINE HYCLATE 100 MG PO CAPS: 100.0000 mg | ORAL_CAPSULE | Freq: Two times a day (BID) | ORAL | 0 refills | Status: DC

## 2021-08-29 NOTE — ED Triage Notes (Signed)
Wheezing and SOB x the past couple of weeks.  States she is coughing up green sputum.

## 2021-08-29 NOTE — Discharge Instructions (Signed)
Use albuterol as directed.

## 2021-09-04 NOTE — ED Provider Notes (Signed)
[facility]    CSN: [id number] Arrival date & time: 08/29/21  1200      History   Chief Complaint No chief complaint on file.   HPI [name] [name] is a 70 y.o. female.   Pt complains of a cough and congestion    The history is provided by the patient. No language interpreter was used.  Cough Associated symptoms: wheezing    Past Medical History:  Diagnosis Date   Atrial septal defect    COPD (chronic obstructive pulmonary disease) (HCC)    Dyspnea    Hyperlipidemia    Hypertension    pulmonary   Kyphoscoliosis    thoracic spine    Patient Active Problem List   Diagnosis Date Noted   Incomplete right bundle branch block 08/12/2016   Hyperlipidemia 08/10/2015   TACHYCARDIA 12/29/2009   HYPERTENSION, PULMONARY 04/01/2009   COPD 04/01/2009   KYPHOSCOLIOSIS, THORACIC SPINE 04/01/2009   ATRIAL SEPTAL DEFECT, SECUNDUM TYPE 04/01/2009   DOE (dyspnea on exertion) 04/01/2009    Past Surgical History:  Procedure Laterality Date   CARDIAC SURGERY     TONSILLECTOMY      OB History   No obstetric history on file.      Home Medications    Prior to Admission medications   Medication Sig Start Date End Date Taking? Authorizing Provider  amoxicillin (AMOXIL) 400 MG/5ML suspension 10 ml po bid 08/29/21  Yes Cheron Schaumann K, PA-C  albuterol (PROVENTIL) (2.5 MG/3ML) 0.083% nebulizer solution Take 2.5 mg by nebulization every 6 (six) hours as needed for wheezing or shortness of breath.  04/30/19   [provider]  ALPRAZolam Prudy Feeler) 0.5 MG tablet Take 0.25-0.5 mg by mouth at bedtime as needed for anxiety. *May take up to 4 times daily as needed for anxiety    [provider]  Cholecalciferol (VITAMIN D) 50 MCG (2000 UT) tablet Take 2,000 Units by mouth daily.    [provider]  SYNTHROID 50 MCG tablet TAKE ONE TABLET BY MOUTH DAILY. Patient taking differently: Take 50 mcg by mouth daily before breakfast. 09/08/16   Lazaro Arms, MD   VENTOLIN HFA 108 (90 BASE) MCG/ACT inhaler Inhale 1-2 puffs into the lungs every 6 (six) hours as needed for wheezing or shortness of breath.  11/16/13   [provider]    Family History Family History  Problem Relation Age of Onset   Lung cancer Paternal Grandfather    Breast cancer Paternal Grandmother    Colon cancer Father    Stroke Mother    Hypertension Mother    Pancreatic cancer Maternal Aunt     Social History Social History   Tobacco Use   Smoking status: Former   Smokeless tobacco: Never  Building services engineer Use: Never used  Substance Use Topics   Alcohol use: Yes    Comment: occasional   Drug use: No     Allergies   Levofloxacin, Sulfonamide derivatives, and Moxifloxacin   Review of Systems Review of Systems  Respiratory:  Positive for cough and wheezing.   All other systems reviewed and are negative.   Physical Exam Triage Vital Signs ED Triage Vitals  Enc Vitals Group     BP 08/29/21 1229 (!) 146/82     Pulse Rate 08/29/21 1229 90     Resp 08/29/21 1229 18     Temp 08/29/21 1229 98.1 F (36.7 C)     Temp Source 08/29/21 1229 Oral  SpO2 08/29/21 1229 95 %     Weight --      Height --      Head Circumference --      Peak Flow --      Pain Score 08/29/21 1230 4     Pain Loc --      Pain Edu? --      Excl. in GC? --    No data found.  Updated Vital Signs BP (!) 146/82 (BP Location: Left Arm)   Pulse 90   Temp 98.1 F (36.7 C) (Oral)   Resp 18   SpO2 95%   Visual Acuity Right Eye Distance:   Left Eye Distance:   Bilateral Distance:    Right Eye Near:   Left Eye Near:    Bilateral Near:     Physical Exam Vitals and nursing note reviewed.  Constitutional:      Appearance: She is well-developed.  HENT:     Head: Normocephalic.     Nose: Nose normal.     Mouth/Throat:     Mouth: Mucous membranes are moist.  Cardiovascular:     Rate and Rhythm: Normal rate.  Pulmonary:     Effort: Pulmonary effort is normal.      Breath sounds: Rhonchi present.  Abdominal:     General: Abdomen is flat. There is no distension.  Musculoskeletal:        General: Normal range of motion.     Cervical back: Normal range of motion.  Neurological:     Mental Status: She is alert and oriented to person, place, and time.  Psychiatric:        Mood and Affect: Mood normal.     UC Treatments / Results  Labs (all labs ordered are listed, but only abnormal results are displayed) Labs Reviewed - No data to display  EKG   Radiology No results found.  Procedures Procedures (including critical care time)  Medications Ordered in UC Medications - No data to display  Initial Impression / Assessment and Plan / UC Course  I have reviewed the triage vital signs and the nursing notes.  Pertinent labs & imaging results that were available during my care of the patient were reviewed by me and considered in my medical decision making (see chart for details).     MDM:  Pt request liquid amoxicillian  Final Clinical Impressions(s) / UC Diagnoses   Final diagnoses:  Acute bronchitis, unspecified organism     Discharge Instructions      Use albuterol as directed    ED Prescriptions     Medication Sig Dispense Auth. Provider   doxycycline (VIBRAMYCIN) 100 MG capsule  (Status: Discontinued) Take 1 capsule (100 mg total) by mouth 2 (two) times daily. 20 capsule Zephyr Sausedo K, [place]   amoxicillin (AMOXIL) 400 MG/5ML suspension 10 ml po bid 200 mL Elson Areas, [place]      An After Visit Summary was printed and given to the patient.  PDMP not reviewed this encounter.   Elson Areas, [place] 09/04/21 1546

## 2021-11-05 ENCOUNTER — Other Ambulatory Visit (HOSPITAL_COMMUNITY): Payer: Self-pay | Admitting: Family Medicine

## 2021-11-05 DIAGNOSIS — Z1231 Encounter for screening mammogram for malignant neoplasm of breast: Secondary | ICD-10-CM

## 2021-12-13 ENCOUNTER — Ambulatory Visit (HOSPITAL_COMMUNITY): Payer: Medicare Other

## 2022-01-03 ENCOUNTER — Ambulatory Visit (HOSPITAL_COMMUNITY): Payer: Medicare Other

## 2022-01-04 ENCOUNTER — Ambulatory Visit (HOSPITAL_COMMUNITY)
Admission: RE | Admit: 2022-01-04 | Discharge: 2022-01-04 | Disposition: A | Discharge status: Home / Self Care | Payer: Medicare Other | Source: Ambulatory Visit | Attending: Family Medicine | Admitting: Family Medicine

## 2022-01-04 ENCOUNTER — Other Ambulatory Visit: Payer: Self-pay

## 2022-01-04 DIAGNOSIS — Z1231 Encounter for screening mammogram for malignant neoplasm of breast: Secondary | ICD-10-CM | POA: Diagnosis not present

## 2022-04-22 ENCOUNTER — Ambulatory Visit
Admission: EM | Admit: 2022-04-22 | Discharge: 2022-04-22 | Disposition: A | Discharge status: Home / Self Care | Payer: Medicare Other | Attending: Nurse Practitioner | Admitting: Nurse Practitioner

## 2022-04-22 DIAGNOSIS — J208 Acute bronchitis due to other specified organisms: Secondary | ICD-10-CM | POA: Diagnosis not present

## 2022-04-22 DIAGNOSIS — R051 Acute cough: Secondary | ICD-10-CM

## 2022-04-22 MED ORDER — BENZONATATE 100 MG PO CAPS: 100.0000 mg | ORAL_CAPSULE | Freq: Three times a day (TID) | ORAL | 0 refills | Status: AC | PRN

## 2022-04-22 MED ORDER — PROMETHAZINE-DM 6.25-15 MG/5ML PO SYRP: 5.0000 mL | ORAL_SOLUTION | Freq: Four times a day (QID) | ORAL | 0 refills | Status: DC | PRN

## 2022-04-22 MED ORDER — PREDNISONE 20 MG PO TABS: 40.0000 mg | ORAL_TABLET | Freq: Every day | ORAL | 0 refills | Status: AC

## 2022-04-22 NOTE — ED Provider Notes (Signed)
[facility]    CSN: [id number] Arrival date & time: 04/22/22  1000      History   Chief Complaint Chief Complaint  Patient presents with   Cough   Sore Throat         HPI [name] [name] is a 71 y.o. female.   The patient is a 71 year old female who presents with cough and sore throat.  Symptoms have been present for the past 2 days.  The patient reports that she has a scratchy throat when coughing, wheezing and chest congestion.  States cough is worse at night.  Cough is productive of clear to yellow sputum.  Patient denies fever, chills, ear pain, nasal congestion, or GI symptoms.  Patient states that she thinks she has bronchitis, states that she gets it twice a year.. Albuterol inhaler gives some relief.  Also been using over-the-counter cough medication.  The history is provided by the patient.  Sore Throat   Past Medical History:  Diagnosis Date   Atrial septal defect    COPD (chronic obstructive pulmonary disease) (HCC)    Dyspnea    Hyperlipidemia    Hypertension    pulmonary   Kyphoscoliosis    thoracic spine    Patient Active Problem List   Diagnosis Date Noted   Incomplete right bundle branch block 08/12/2016   Hyperlipidemia 08/10/2015   TACHYCARDIA 12/29/2009   HYPERTENSION, PULMONARY 04/01/2009   COPD 04/01/2009   KYPHOSCOLIOSIS, THORACIC SPINE 04/01/2009   ATRIAL SEPTAL DEFECT, SECUNDUM TYPE 04/01/2009   DOE (dyspnea on exertion) 04/01/2009    Past Surgical History:  Procedure Laterality Date   CARDIAC SURGERY     TONSILLECTOMY      OB History   No obstetric history on file.      Home Medications    Prior to Admission medications   Medication Sig Start Date End Date Taking? Authorizing Provider  benzonatate (TESSALON PERLES) 100 MG capsule Take 1 capsule (100 mg total) by mouth 3 (three) times daily as needed for up to 10 days for cough. 04/22/22 05/02/22 Yes Eden Toohey-Warren, Sadie Haberhristie J, NP  predniSONE (DELTASONE) 20 MG tablet  Take 2 tablets (40 mg total) by mouth daily with breakfast for 5 days. 04/22/22 04/27/22 Yes Merrilee Ancona-Warren, Sadie Haberhristie J, NP  promethazine-dextromethorphan (PROMETHAZINE-DM) 6.25-15 MG/5ML syrup Take 5 mLs by mouth 4 (four) times daily as needed for cough. 04/22/22  Yes Cerissa Zeiger-Warren, Sadie Haberhristie J, NP  albuterol (PROVENTIL) (2.5 MG/3ML) 0.083% nebulizer solution Take 2.5 mg by nebulization every 6 (six) hours as needed for wheezing or shortness of breath.  04/30/19   [provider]  ALPRAZolam Prudy Feeler(XANAX) 0.5 MG tablet Take 0.25-0.5 mg by mouth at bedtime as needed for anxiety. *May take up to 4 times daily as needed for anxiety    [provider]  amoxicillin (AMOXIL) 400 MG/5ML suspension 10 ml po bid 08/29/21   Elson AreasSofia, Leslie K, PA-C  Cholecalciferol (VITAMIN D) 50 MCG (2000 UT) tablet Take 2,000 Units by mouth daily.    [provider]  SYNTHROID 50 MCG tablet TAKE ONE TABLET BY MOUTH DAILY. Patient taking differently: Take 50 mcg by mouth daily before breakfast. 09/08/16   Lazaro ArmsEure, Luther H, MD  VENTOLIN HFA 108 (90 BASE) MCG/ACT inhaler Inhale 1-2 puffs into the lungs every 6 (six) hours as needed for wheezing or shortness of breath.  11/16/13   [provider]    Family History Family History  Problem Relation Age of Onset   Lung cancer Paternal Grandfather  Breast cancer Paternal Grandmother    Colon cancer Father    Stroke Mother    Hypertension Mother    Pancreatic cancer Maternal Aunt     Social History Social History   Tobacco Use   Smoking status: Former   Smokeless tobacco: Never  Building services engineer Use: Never used  Substance Use Topics   Alcohol use: Yes    Comment: occasional   Drug use: No     Allergies   Levofloxacin, Sulfonamide derivatives, Moxifloxacin, and Moxifloxacin hcl in nacl   Review of Systems Review of Systems PER HPI  Physical Exam Triage Vital Signs ED Triage Vitals  Enc Vitals Group     BP 04/22/22 1220 (!) 160/93      Pulse Rate 04/22/22 1220 93     Resp 04/22/22 1220 20     Temp 04/22/22 1220 98.1 F (36.7 C)     Temp Source 04/22/22 1220 Oral     SpO2 04/22/22 1220 96 %     Weight --      Height --      Head Circumference --      Peak Flow --      Pain Score 04/22/22 1218 0     Pain Loc --      Pain Edu? --      Excl. in GC? --    No data found.  Updated Vital Signs BP (!) 160/93 (BP Location: Left Arm)   Pulse 93   Temp 98.1 F (36.7 C) (Oral)   Resp 20   SpO2 96%   Visual Acuity Right Eye Distance:   Left Eye Distance:   Bilateral Distance:    Right Eye Near:   Left Eye Near:    Bilateral Near:     Physical Exam Vitals and nursing note reviewed.  Constitutional:      General: She is not in acute distress.    Appearance: She is well-developed.  HENT:     Head: Normocephalic and atraumatic.     Right Ear: Tympanic membrane and ear canal normal.     Left Ear: Tympanic membrane and ear canal normal.     Nose: No congestion.     Mouth/Throat:     Mouth: Mucous membranes are moist.     Pharynx: Uvula midline. Posterior oropharyngeal erythema present. No pharyngeal swelling.     Tonsils: No tonsillar exudate.  Eyes:     Conjunctiva/sclera: Conjunctivae normal.     Pupils: Pupils are equal, round, and reactive to light.  Neck:     Thyroid: No thyromegaly.     Trachea: No tracheal deviation.  Cardiovascular:     Rate and Rhythm: Normal rate and regular rhythm.     Heart sounds: Normal heart sounds.  Pulmonary:     Effort: Pulmonary effort is normal. No respiratory distress.     Breath sounds: Normal breath sounds. No stridor. No wheezing, rhonchi or rales.  Chest:     Chest wall: No tenderness.  Abdominal:     General: Bowel sounds are normal. There is no distension.     Palpations: Abdomen is soft.     Tenderness: There is no abdominal tenderness.  Musculoskeletal:     Cervical back: Normal range of motion and neck supple.  Skin:    General: Skin is warm and  dry.     Capillary Refill: Capillary refill takes less than 2 seconds.  Neurological:     General: No focal deficit present.  Mental Status: She is alert and oriented to person, place, and time.  Psychiatric:        Mood and Affect: Mood normal.        Behavior: Behavior normal.        Thought Content: Thought content normal.        Judgment: Judgment normal.     UC Treatments / Results  Labs (all labs ordered are listed, but only abnormal results are displayed) Labs Reviewed - No data to display  EKG   Radiology No results found.  Procedures Procedures (including critical care time)  Medications Ordered in UC Medications - No data to display  Initial Impression / Assessment and Plan / UC Course  I have reviewed the triage vital signs and the nursing notes.  Pertinent labs & imaging results that were available during my care of the patient were reviewed by me and considered in my medical decision making (see chart for details).  Patient is a 71 year old female who presents with cough and sore throat for the past 2 days.  Her exam and vital signs are reassuring.  She does not have any wheezing on exam.  Patient does have a history of COPD.  We will start her on prednisone for 5 days.  We will also provide cough medicine for symptomatic relief.  Discussed with patient that antibiotics are not necessary at this time as she has only had her symptoms for 2 days, and she is also been afebrile and does not display any other bacterial etiology.  Patient was given strict return precautions.  Patient also advised to provide supportive care to include increasing fluids and allowing for plenty of rest.  Patient advised to follow-up if she has fever, chills, worsening cough, shortness of breath, or difficulty breathing. Final Clinical Impressions(s) / UC Diagnoses   Final diagnoses:  Acute cough  Acute bronchitis due to other specified organisms     Discharge Instructions       Take medication as prescribed. Increase fluids and allow for plenty of rest. Recommend using a humidifier to help with cough during bedtime. May take over-the-counter ibuprofen or Tylenol for pain or fever. Follow-up in the ER if you have worsening shortness of breath, difficulty breathing, or other concerns. Follow-up in our clinic as needed or with PCP.     ED Prescriptions     Medication Sig Dispense Auth. Provider   predniSONE (DELTASONE) 20 MG tablet Take 2 tablets (40 mg total) by mouth daily with breakfast for 5 days. 10 tablet Dondre Catalfamo-Warren, Sadie Haber, NP   benzonatate (TESSALON PERLES) 100 MG capsule Take 1 capsule (100 mg total) by mouth 3 (three) times daily as needed for up to 10 days for cough. 30 capsule Rolly Magri-Warren, Sadie Haber, NP   promethazine-dextromethorphan (PROMETHAZINE-DM) 6.25-15 MG/5ML syrup Take 5 mLs by mouth 4 (four) times daily as needed for cough. 140 mL Rosamond Andress-Warren, Sadie Haber, NP      PDMP not reviewed this encounter.   Abran Cantor, NP 04/22/22 [id number]

## 2022-04-22 NOTE — Discharge Instructions (Signed)
Take medication as prescribed. Increase fluids and allow for plenty of rest. Recommend using a humidifier to help with cough during bedtime. May take over-the-counter ibuprofen or Tylenol for pain or fever. Follow-up in the ER if you have worsening shortness of breath, difficulty breathing, or other concerns. Follow-up in our clinic as needed or with PCP.

## 2022-04-22 NOTE — ED Triage Notes (Signed)
Pt reports, cough, scratchy throat when coughing, wheezing and chest congestion x 2 days. Pt think she has bronchitis. Albuterol inhaler gives some relief.

## 2022-05-03 ENCOUNTER — Other Ambulatory Visit: Payer: Self-pay | Admitting: Family Medicine

## 2022-05-03 ENCOUNTER — Other Ambulatory Visit (HOSPITAL_COMMUNITY): Payer: Self-pay | Admitting: Family Medicine

## 2022-05-03 DIAGNOSIS — Z122 Encounter for screening for malignant neoplasm of respiratory organs: Secondary | ICD-10-CM

## 2022-05-07 ENCOUNTER — Other Ambulatory Visit (HOSPITAL_COMMUNITY): Payer: Self-pay | Admitting: Family Medicine

## 2022-05-07 DIAGNOSIS — Z13820 Encounter for screening for osteoporosis: Secondary | ICD-10-CM

## 2022-05-23 ENCOUNTER — Ambulatory Visit (HOSPITAL_COMMUNITY)
Admission: RE | Admit: 2022-05-23 | Discharge: 2022-05-23 | Disposition: A | Discharge status: Home / Self Care | Payer: Medicare Other | Source: Ambulatory Visit | Attending: Family Medicine | Admitting: Family Medicine

## 2022-05-23 DIAGNOSIS — Z780 Asymptomatic menopausal state: Secondary | ICD-10-CM | POA: Insufficient documentation

## 2022-05-23 DIAGNOSIS — M810 Age-related osteoporosis without current pathological fracture: Secondary | ICD-10-CM | POA: Diagnosis not present

## 2022-05-23 DIAGNOSIS — Z13820 Encounter for screening for osteoporosis: Secondary | ICD-10-CM | POA: Insufficient documentation

## 2022-06-21 ENCOUNTER — Ambulatory Visit (HOSPITAL_COMMUNITY): Payer: Medicare Other

## 2022-07-19 ENCOUNTER — Ambulatory Visit (HOSPITAL_COMMUNITY)
Admission: RE | Admit: 2022-07-19 | Discharge: 2022-07-19 | Disposition: A | Discharge status: Home / Self Care | Payer: Medicare Other | Source: Ambulatory Visit | Attending: Family Medicine | Admitting: Family Medicine

## 2022-07-19 DIAGNOSIS — J439 Emphysema, unspecified: Secondary | ICD-10-CM | POA: Insufficient documentation

## 2022-07-19 DIAGNOSIS — Z87891 Personal history of nicotine dependence: Secondary | ICD-10-CM | POA: Insufficient documentation

## 2022-07-19 DIAGNOSIS — Z122 Encounter for screening for malignant neoplasm of respiratory organs: Secondary | ICD-10-CM | POA: Diagnosis not present

## 2022-07-19 DIAGNOSIS — I700 Atherosclerosis of aorta: Secondary | ICD-10-CM | POA: Insufficient documentation

## 2022-08-07 DIAGNOSIS — J449 Chronic obstructive pulmonary disease, unspecified: Secondary | ICD-10-CM | POA: Diagnosis not present

## 2022-10-30 DIAGNOSIS — E785 Hyperlipidemia, unspecified: Secondary | ICD-10-CM | POA: Diagnosis not present

## 2022-10-30 DIAGNOSIS — R7303 Prediabetes: Secondary | ICD-10-CM | POA: Diagnosis not present

## 2022-10-30 DIAGNOSIS — Z131 Encounter for screening for diabetes mellitus: Secondary | ICD-10-CM | POA: Diagnosis not present

## 2022-11-06 DIAGNOSIS — R7303 Prediabetes: Secondary | ICD-10-CM | POA: Diagnosis not present

## 2022-11-06 DIAGNOSIS — Z131 Encounter for screening for diabetes mellitus: Secondary | ICD-10-CM | POA: Diagnosis not present

## 2022-11-06 DIAGNOSIS — E785 Hyperlipidemia, unspecified: Secondary | ICD-10-CM | POA: Diagnosis not present

## 2022-11-06 DIAGNOSIS — M810 Age-related osteoporosis without current pathological fracture: Secondary | ICD-10-CM | POA: Diagnosis not present

## 2022-11-06 DIAGNOSIS — J449 Chronic obstructive pulmonary disease, unspecified: Secondary | ICD-10-CM | POA: Diagnosis not present

## 2022-11-06 DIAGNOSIS — M79621 Pain in right upper arm: Secondary | ICD-10-CM | POA: Diagnosis not present

## 2022-11-06 DIAGNOSIS — Z23 Encounter for immunization: Secondary | ICD-10-CM | POA: Diagnosis not present

## 2023-01-29 DIAGNOSIS — Z87891 Personal history of nicotine dependence: Secondary | ICD-10-CM | POA: Diagnosis not present

## 2023-01-29 DIAGNOSIS — R059 Cough, unspecified: Secondary | ICD-10-CM | POA: Diagnosis not present

## 2023-01-29 DIAGNOSIS — Z7951 Long term (current) use of inhaled steroids: Secondary | ICD-10-CM | POA: Diagnosis not present

## 2023-01-29 DIAGNOSIS — J441 Chronic obstructive pulmonary disease with (acute) exacerbation: Secondary | ICD-10-CM | POA: Diagnosis not present

## 2023-01-29 DIAGNOSIS — J449 Chronic obstructive pulmonary disease, unspecified: Secondary | ICD-10-CM | POA: Diagnosis not present

## 2023-03-21 DIAGNOSIS — Z79899 Other long term (current) drug therapy: Secondary | ICD-10-CM | POA: Diagnosis not present

## 2023-03-21 DIAGNOSIS — Z87891 Personal history of nicotine dependence: Secondary | ICD-10-CM | POA: Diagnosis not present

## 2023-03-21 DIAGNOSIS — R5383 Other fatigue: Secondary | ICD-10-CM | POA: Diagnosis not present

## 2023-03-21 DIAGNOSIS — J449 Chronic obstructive pulmonary disease, unspecified: Secondary | ICD-10-CM | POA: Diagnosis not present

## 2023-04-01 DIAGNOSIS — J441 Chronic obstructive pulmonary disease with (acute) exacerbation: Secondary | ICD-10-CM | POA: Diagnosis not present

## 2023-04-01 DIAGNOSIS — R062 Wheezing: Secondary | ICD-10-CM | POA: Diagnosis not present

## 2023-05-08 DIAGNOSIS — R7303 Prediabetes: Secondary | ICD-10-CM | POA: Diagnosis not present

## 2023-05-08 DIAGNOSIS — Z131 Encounter for screening for diabetes mellitus: Secondary | ICD-10-CM | POA: Diagnosis not present

## 2023-05-08 DIAGNOSIS — E785 Hyperlipidemia, unspecified: Secondary | ICD-10-CM | POA: Diagnosis not present

## 2023-05-08 DIAGNOSIS — M810 Age-related osteoporosis without current pathological fracture: Secondary | ICD-10-CM | POA: Diagnosis not present

## 2023-05-14 DIAGNOSIS — Z0000 Encounter for general adult medical examination without abnormal findings: Secondary | ICD-10-CM | POA: Diagnosis not present

## 2023-05-15 ENCOUNTER — Other Ambulatory Visit (HOSPITAL_COMMUNITY): Payer: Self-pay | Admitting: Family Medicine

## 2023-05-15 DIAGNOSIS — I700 Atherosclerosis of aorta: Secondary | ICD-10-CM | POA: Diagnosis not present

## 2023-05-15 DIAGNOSIS — E039 Hypothyroidism, unspecified: Secondary | ICD-10-CM | POA: Diagnosis not present

## 2023-05-15 DIAGNOSIS — J439 Emphysema, unspecified: Secondary | ICD-10-CM | POA: Diagnosis not present

## 2023-05-15 DIAGNOSIS — M810 Age-related osteoporosis without current pathological fracture: Secondary | ICD-10-CM | POA: Diagnosis not present

## 2023-05-15 DIAGNOSIS — J449 Chronic obstructive pulmonary disease, unspecified: Secondary | ICD-10-CM | POA: Diagnosis not present

## 2023-05-15 DIAGNOSIS — Z131 Encounter for screening for diabetes mellitus: Secondary | ICD-10-CM | POA: Diagnosis not present

## 2023-05-15 DIAGNOSIS — Z1231 Encounter for screening mammogram for malignant neoplasm of breast: Secondary | ICD-10-CM

## 2023-05-15 DIAGNOSIS — E785 Hyperlipidemia, unspecified: Secondary | ICD-10-CM | POA: Diagnosis not present

## 2023-05-15 DIAGNOSIS — R5383 Other fatigue: Secondary | ICD-10-CM | POA: Diagnosis not present

## 2023-05-15 DIAGNOSIS — Z1211 Encounter for screening for malignant neoplasm of colon: Secondary | ICD-10-CM | POA: Diagnosis not present

## 2023-05-15 DIAGNOSIS — M79621 Pain in right upper arm: Secondary | ICD-10-CM | POA: Diagnosis not present

## 2023-05-15 DIAGNOSIS — R7303 Prediabetes: Secondary | ICD-10-CM | POA: Diagnosis not present

## 2023-05-16 ENCOUNTER — Encounter: Payer: Self-pay | Admitting: Internal Medicine

## 2023-05-16 ENCOUNTER — Ambulatory Visit: Payer: Medicare Other | Attending: Cardiology | Admitting: Internal Medicine

## 2023-05-16 VITALS — BP 142/90 | HR 86 | Ht 61.0 in | Wt 118.8 lb

## 2023-05-16 DIAGNOSIS — Z8774 Personal history of (corrected) congenital malformations of heart and circulatory system: Secondary | ICD-10-CM | POA: Diagnosis not present

## 2023-05-16 DIAGNOSIS — I4510 Unspecified right bundle-branch block: Secondary | ICD-10-CM | POA: Diagnosis not present

## 2023-05-16 NOTE — Progress Notes (Signed)
Cardiology Office Note  Date: 05/16/2023   ID: [name] [name], DOB [birth year], MRN [id number]  PCP:  Gareth Morgan, MD  Cardiologist:  Marjo Bicker, MD Electrophysiologist:  None   Reason for Office Visit: Follow-up of ASD repair   History of Present Illness: [name] [name] is a 72 y.o. female known to have ASD repair in 2011, HLD is here for follow-up visit.  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 Normal echocardiograms in 2015 and 2019. Normal GXT for chest pain in 2015. I reviewed her lipid panel today that showed LDL 168 which was from 10/2022. She refused to take statin because her mother had myalgias with statins. Refused to take statins after further discussion. She has no symptoms of angina, dizziness, syncope.  She does have vertigo for which she does not take any meclizine over-the-counter. She reported having SOB with exertion for the last 1 month, some orthopnea as well. She thinks this could be from pollen allergies which happens around this time of the year.  Past Medical History:  Diagnosis Date   Atrial septal defect    COPD (chronic obstructive pulmonary disease) (HCC)    Dyspnea    Hyperlipidemia    Hypertension    pulmonary   Kyphoscoliosis    thoracic spine    Past Surgical History:  Procedure Laterality Date   CARDIAC SURGERY     TONSILLECTOMY      Current Outpatient Medications  Medication Sig Dispense Refill   albuterol (PROVENTIL) (2.5 MG/3ML) 0.083% nebulizer solution Take 2.5 mg by nebulization every 6 (six) hours as needed for wheezing or shortness of breath.      ALPRAZolam (XANAX) 0.5 MG tablet Take 0.25-0.5 mg by mouth at bedtime as needed for anxiety. *May take up to 4 times daily as needed for anxiety     Cholecalciferol (VITAMIN D) 50 MCG (2000 UT) tablet Take 2,000 Units by mouth daily.     SYNTHROID 50 MCG tablet TAKE ONE TABLET BY MOUTH DAILY. (Patient taking  differently: Take 50 mcg by mouth daily before breakfast.) 30 tablet 3   VENTOLIN HFA 108 (90 BASE) MCG/ACT inhaler Inhale 1-2 puffs into the lungs every 6 (six) hours as needed for wheezing or shortness of breath.      No current facility-administered medications for this visit.   Allergies:  Levofloxacin, Sulfonamide derivatives, Moxifloxacin, and Moxifloxacin hcl in nacl   Social History: The patient  reports that she has quit smoking. She has never used smokeless tobacco. She reports current alcohol use. She reports that she does not use drugs.   Family History: The patient's family history includes Breast cancer in her paternal grandmother; Colon cancer in her father; Hypertension in her mother; Lung cancer in her paternal grandfather; Pancreatic cancer in her maternal aunt; Stroke in her mother.   ROS:  Please see the history of present illness. Otherwise, complete review of systems is positive for none.  All other systems are reviewed and negative.   Physical Exam: VS:  BP (!) 142/90   Pulse 86   Ht 5\' 1"  (1.549 m)   Wt 118 lb 12.8 oz (53.9 kg)   SpO2 97%   BMI 22.45 kg/m , BMI Body mass index is 22.45 kg/m.  Wt Readings from Last 3 Encounters:  05/16/23 118 lb 12.8 oz (53.9 kg)  02/06/21 117 lb 6.4 oz (53.3 kg)  05/10/19 114 lb (51.7 kg)  General: Patient appears comfortable at rest. HEENT: Conjunctiva and lids normal, oropharynx clear with moist mucosa. Neck: Supple, no elevated JVP or carotid bruits, no thyromegaly. Lungs: Clear to auscultation, nonlabored breathing at rest. Cardiac: Regular rate and rhythm, no S3 or significant systolic murmur, no pericardial rub. Abdomen: Soft, nontender, no hepatomegaly, bowel sounds present, no guarding or rebound. Extremities: No pitting edema, distal pulses 2+. Skin: Warm and dry. Musculoskeletal: No kyphosis. Neuropsychiatric: Alert and oriented x3, affect grossly appropriate.  Recent Labwork: No results found for requested  labs within last 365 days.     Component Value Date/Time   CHOL  11/09/2009 0455    114        ATP III CLASSIFICATION:  <200     mg/dL   Desirable  161-096  mg/dL   Borderline High  >=045    mg/dL   High          TRIG 54 11/09/2009 0455   HDL 38 (L) 11/09/2009 0455   CHOLHDL 3.0 11/09/2009 0455   VLDL 11 11/09/2009 0455   LDLCALC  11/09/2009 0455    65        Total Cholesterol/HDL:CHD Risk Coronary Heart Disease Risk Table                     Men   Women  1/2 Average Risk   3.4   3.3  Average Risk       5.0   4.4  2 X Average Risk   9.6   7.1  3 X Average Risk  23.4   11.0        Use the calculated Patient Ratio above and the CHD Risk Table to determine the patient's CHD Risk.        ATP III CLASSIFICATION (LDL):  <100     mg/dL   Optimal  409-811  mg/dL   Near or Above                    Optimal  130-159  mg/dL   Borderline  914-782  mg/dL   High  >956     mg/dL   Very High    Assessment and Plan:  Patient is a 72 year old F known to have ASD repair in 2011, HLD is here for follow-up visit.  # ASD repair in 2011 -Echocardiograms in 2015 and 2019 showed no evidence of interatrial shunt.  Due to new symptoms of DOE, orthopnea, will obtain 2D echocardiogram but these symptoms could be likely from pollen allergies as well.  # HLD -Reviewed lipid panel from 10/2022 that showed LDL 168.  Patient refused taking statins as her mother experienced myalgias with statins.  Educated about taking over-the-counter red yeast rice which she will think about it.  I have spent a total of 30 minutes with patient reviewing chart, EKGs, labs and examining patient as well as establishing an assessment and plan that was discussed with the patient.  > 50% of time was spent in direct patient care.    Medication Adjustments/Labs and Tests Ordered: Current medicines are reviewed at length with the patient today.  Concerns regarding medicines are outlined above.   Tests Ordered: Orders  Placed This Encounter  Procedures   EKG 12-Lead   ECHOCARDIOGRAM COMPLETE    Medication Changes: No orders of the defined types were placed in this encounter.   Disposition:  Follow up  one year  Signed, Oyuki Hogan Verne Spurr, MD, 05/16/2023 9:39 AM  [place] [facility] [address] [place], [place] [address]

## 2023-05-16 NOTE — Patient Instructions (Signed)
Medication Instructions:  Your physician recommends that you continue on your current medications as directed. Please refer to the Current Medication list given to you today.   Labwork: None today  Testing/Procedures: Your physician has requested that you have an echocardiogram. Echocardiography is a painless test that uses sound waves to create images of your heart. It provides your doctor with information about the size and shape of your heart and how well your heart's chambers and valves are working. This procedure takes approximately one hour. There are no restrictions for this procedure. Please do NOT wear cologne, perfume, aftershave, or lotions (deodorant is allowed). Please arrive 15 minutes prior to your appointment time.   Follow-Up: 1 year  Any Other Special Instructions Will Be Listed Below (If Applicable).  If you need a refill on your cardiac medications before your next appointment, please call your pharmacy.  

## 2023-06-12 ENCOUNTER — Ambulatory Visit: Payer: Medicare Other | Admitting: Cardiology

## 2023-06-30 ENCOUNTER — Ambulatory Visit (HOSPITAL_COMMUNITY)
Admission: RE | Admit: 2023-06-30 | Discharge: 2023-06-30 | Disposition: A | Discharge status: Home / Self Care | Payer: Medicare Other | Source: Ambulatory Visit | Attending: Internal Medicine | Admitting: Internal Medicine

## 2023-06-30 DIAGNOSIS — J449 Chronic obstructive pulmonary disease, unspecified: Secondary | ICD-10-CM | POA: Diagnosis not present

## 2023-06-30 DIAGNOSIS — I4510 Unspecified right bundle-branch block: Secondary | ICD-10-CM | POA: Diagnosis not present

## 2023-06-30 DIAGNOSIS — E785 Hyperlipidemia, unspecified: Secondary | ICD-10-CM | POA: Diagnosis not present

## 2023-06-30 DIAGNOSIS — Z8774 Personal history of (corrected) congenital malformations of heart and circulatory system: Secondary | ICD-10-CM | POA: Insufficient documentation

## 2023-06-30 DIAGNOSIS — I10 Essential (primary) hypertension: Secondary | ICD-10-CM | POA: Diagnosis not present

## 2023-06-30 DIAGNOSIS — R0609 Other forms of dyspnea: Secondary | ICD-10-CM | POA: Diagnosis not present

## 2023-06-30 LAB — ECHOCARDIOGRAM COMPLETE
AR max vel: 2.59 cm2
AV Area VTI: 2.51 cm2
AV Area mean vel: 2.63 cm2
AV Mean grad: 2.9 mmHg
AV Peak grad: 6.3 mmHg
Ao pk vel: 1.26 m/s
Area-P 1/2: 2.87 cm2
S' Lateral: 2.5 cm

## 2023-06-30 NOTE — Progress Notes (Signed)
*  PRELIMINARY RESULTS* Echocardiogram 2D Echocardiogram has been performed.  Stacey Drain 06/30/2023, 11:14 AM

## 2023-07-04 ENCOUNTER — Telehealth: Payer: Self-pay

## 2023-07-04 MED ORDER — FUROSEMIDE 20 MG PO TABS: 20.0000 mg | ORAL_TABLET | Freq: Every day | ORAL | 1 refills | Status: AC | PRN

## 2023-07-04 NOTE — Telephone Encounter (Signed)
-----   Message from Vishnu P Mallipeddi sent at 07/04/2023  8:41 AM EDT ----- Normal heart pumping function, G1 DD (mild stiff heart) and no valvular heart disease. No interatrial shunting (ASD repair is intact).  Patient's symptoms are not entirely convincing for HFpEF, could be pollen allergies but she can start taking p.o. Lasix 20 mg as needed for SOB.

## 2023-07-04 NOTE — Telephone Encounter (Signed)
Patient verbalized understanding. Patient had no questions or concerns at this time. PCP copied.

## 2023-07-11 DIAGNOSIS — Z1211 Encounter for screening for malignant neoplasm of colon: Secondary | ICD-10-CM | POA: Diagnosis not present

## 2023-07-17 ENCOUNTER — Encounter: Payer: Self-pay | Admitting: *Deleted

## 2023-08-10 ENCOUNTER — Ambulatory Visit
Admission: EM | Admit: 2023-08-10 | Discharge: 2023-08-10 | Disposition: A | Discharge status: Home / Self Care | Payer: Medicare Other | Attending: Physician Assistant | Admitting: Physician Assistant

## 2023-08-10 DIAGNOSIS — J441 Chronic obstructive pulmonary disease with (acute) exacerbation: Secondary | ICD-10-CM

## 2023-08-10 MED ORDER — PREDNISONE 20 MG PO TABS: 40.0000 mg | ORAL_TABLET | Freq: Every day | ORAL | 0 refills | Status: AC

## 2023-08-10 MED ORDER — AZITHROMYCIN 250 MG PO TABS: 250.0000 mg | ORAL_TABLET | Freq: Every day | ORAL | 0 refills | Status: AC

## 2023-08-10 NOTE — ED Provider Notes (Addendum)
[facility]    CSN: [id number] Arrival date & time: 08/10/23  0945      History   Chief Complaint No chief complaint on file.   HPI [name] [name] is a 72 y.o. female.   Patient presents today with a 1 week history of URI symptoms that have worsened in the past day.  She reports initially she had congestion, sore throat, postnasal drainage but now has developed a severe cough and chills.  She has not measured her temperature and denies any chest pain, shortness of breath, nausea, vomiting.  She does have a history of COPD and has been using her albuterol with temporary improvement of symptoms.  Denies any recent antibiotics or steroids.  She quit smoking several years ago.  She is eating and drinking normally.  Denies any known sick contacts.    Past Medical History:  Diagnosis Date   Atrial septal defect    COPD (chronic obstructive pulmonary disease) (HCC)    Dyspnea    Hyperlipidemia    Hypertension    pulmonary   Kyphoscoliosis    thoracic spine    Patient Active Problem List   Diagnosis Date Noted   Incomplete right bundle branch block 08/12/2016   Hyperlipidemia 08/10/2015   TACHYCARDIA 12/29/2009   HYPERTENSION, PULMONARY 04/01/2009   COPD 04/01/2009   KYPHOSCOLIOSIS, THORACIC SPINE 04/01/2009   ATRIAL SEPTAL DEFECT, SECUNDUM TYPE 04/01/2009   DOE (dyspnea on exertion) 04/01/2009    Past Surgical History:  Procedure Laterality Date   CARDIAC SURGERY     TONSILLECTOMY      OB History   No obstetric history on file.      Home Medications    Prior to Admission medications   Medication Sig Start Date End Date Taking? Authorizing Provider  azithromycin (ZITHROMAX) 250 MG tablet Take 1 tablet (250 mg total) by mouth daily. Take first 2 tablets together, then 1 every day until finished. 08/10/23  Yes Leandro Berkowitz K, PA-C  predniSONE (DELTASONE) 20 MG tablet Take 2 tablets (40 mg total) by mouth daily for 4 days. 08/10/23 08/14/23 Yes Kooper Godshall K,  PA-C  albuterol (PROVENTIL) (2.5 MG/3ML) 0.083% nebulizer solution Take 2.5 mg by nebulization every 6 (six) hours as needed for wheezing or shortness of breath.  04/30/19   [provider]  ALPRAZolam Prudy Feeler) 0.5 MG tablet Take 0.25-0.5 mg by mouth at bedtime as needed for anxiety. *May take up to 4 times daily as needed for anxiety    [provider]  Cholecalciferol (VITAMIN D) 50 MCG (2000 UT) tablet Take 2,000 Units by mouth daily.    [provider]  furosemide (LASIX) 20 MG tablet Take 1 tablet (20 mg total) by mouth daily as needed for fluid or edema (SOB). 07/04/23   Mallipeddi, Vishnu P, MD  SYNTHROID 50 MCG tablet TAKE ONE TABLET BY MOUTH DAILY. Patient taking differently: Take 50 mcg by mouth daily before breakfast. 09/08/16   Lazaro Arms, MD  VENTOLIN HFA 108 (90 BASE) MCG/ACT inhaler Inhale 1-2 puffs into the lungs every 6 (six) hours as needed for wheezing or shortness of breath.  11/16/13   [provider]    Family History Family History  Problem Relation Age of Onset   Lung cancer Paternal Grandfather    Breast cancer Paternal Grandmother    Colon cancer Father    Stroke Mother    Hypertension Mother    Pancreatic cancer Maternal Aunt     Social History Social  History   Tobacco Use   Smoking status: Former   Smokeless tobacco: Never  Vaping Use   Vaping status: Never Used  Substance Use Topics   Alcohol use: Yes    Comment: occasional   Drug use: No     Allergies   Levofloxacin, Sulfonamide derivatives, Moxifloxacin, and Moxifloxacin hcl in nacl   Review of Systems Review of Systems  Constitutional:  Positive for activity change. Negative for appetite change, fatigue and fever.  HENT:  Positive for congestion and postnasal drip. Negative for sinus pressure, sneezing and sore throat.   Respiratory:  Positive for cough and chest tightness. Negative for shortness of breath and wheezing.   Cardiovascular:  Negative for  chest pain.  Gastrointestinal:  Negative for abdominal pain, diarrhea, nausea and vomiting.     Physical Exam Triage Vital Signs ED Triage Vitals [08/10/23 1121]  Encounter Vitals Group     BP 127/80     Systolic BP Percentile      Diastolic BP Percentile      Pulse Rate 79     Resp 20     Temp 97.9 F (36.6 C)     Temp Source Oral     SpO2 93 %     Weight      Height      Head Circumference      Peak Flow      Pain Score 5     Pain Loc      Pain Education      Exclude from Growth Chart    No data found.  Updated Vital Signs BP 127/80 (BP Location: Right Arm)   Pulse 79   Temp 97.9 F (36.6 C) (Oral)   Resp 20   SpO2 93%   Visual Acuity Right Eye Distance:   Left Eye Distance:   Bilateral Distance:    Right Eye Near:   Left Eye Near:    Bilateral Near:     Physical Exam Vitals reviewed.  Constitutional:      General: She is awake. She is not in acute distress.    Appearance: Normal appearance. She is well-developed. She is not ill-appearing.     Comments: Very pleasant female appears stated age sitting comfortably in exam room reading her book in no acute distress  HENT:     Head: Normocephalic and atraumatic.     Right Ear: Tympanic membrane, ear canal and external ear normal. Tympanic membrane is not erythematous or bulging.     Left Ear: Tympanic membrane, ear canal and external ear normal. Tympanic membrane is not erythematous or bulging.     Nose:     Right Sinus: No maxillary sinus tenderness or frontal sinus tenderness.     Left Sinus: No maxillary sinus tenderness or frontal sinus tenderness.     Mouth/Throat:     Pharynx: Uvula midline. No oropharyngeal exudate or posterior oropharyngeal erythema.  Cardiovascular:     Rate and Rhythm: Normal rate and regular rhythm.     Heart sounds: S1 normal and S2 normal. Murmur heard.  Pulmonary:     Effort: Pulmonary effort is normal.     Breath sounds: Normal breath sounds. No wheezing, rhonchi or  rales.     Comments: Clear to auscultation bilaterally Psychiatric:        Behavior: Behavior is cooperative.      UC Treatments / Results  Labs (all labs ordered are listed, but only abnormal results are displayed) Labs Reviewed - No data  to display  EKG   Radiology No results found.  Procedures Procedures (including critical care time)  Medications Ordered in UC Medications - No data to display  Initial Impression / Assessment and Plan / UC Course  I have reviewed the triage vital signs and the nursing notes.  Pertinent labs & imaging results that were available during my care of the patient were reviewed by me and considered in my medical decision making (see chart for details).     Patient is well-appearing, afebrile, nontoxic, nontachycardic.  Viral testing was deferred as she has been symptomatic for over a week and this would not change our management.  I am concerned for COPD exacerbation given her recent increase in productive cough.  Chest x-ray was deferred as she had no adventitious lung sounds on exam and reports episode is similar to previous COPD exacerbations..  Initially discussed potential utility of treating with Augmentin for suspected COPD exacerbation but patient reports that she does best with azithromycin and requested this medication.  We discussed that this was not considered first-line in her circumstance but she preferred this since she does she can tolerate it well.  Azithromycin was sent to pharmacy.  Will also start prednisone burst of 40 mg for 4 days.  We discussed that she is not to take NSAIDs with this medication due to risk of GI bleeding.  Can use over-the-counter medications including Mucinex, Flonase, Tylenol.  Offered prescription for maintenance medication such as Spiriva but she declined this.  She has an appointment with her primary care next week and was strongly encouraged to keep this appointment.  We discussed that if anything changes or  worsens she needs to be seen immediately.  Final Clinical Impressions(s) / UC Diagnoses   Final diagnoses:  COPD exacerbation (HCC)     Discharge Instructions      Take azithromycin as prescribed.  Start prednisone 40 mg for 4 days.  Do not take NSAIDs with this medication due to risk of GI bleeding including aspirin, ibuprofen/Advil/Motrin, naproxen/Aleve.  Continue your inhalers as previously prescribed.  Use Mucinex, Flonase, Tylenol for additional symptom relief.  If anything worsens or changes and you have increasing cough, shortness of breath, fever, weakness, nausea/vomiting you need to be seen immediately.  Keep your appointment with your primary care as scheduled next week even if you are feeling better for a recheck.     ED Prescriptions     Medication Sig Dispense Auth. Provider   azithromycin (ZITHROMAX) 250 MG tablet Take 1 tablet (250 mg total) by mouth daily. Take first 2 tablets together, then 1 every day until finished. 6 tablet Azael Ragain K, PA-C   predniSONE (DELTASONE) 20 MG tablet Take 2 tablets (40 mg total) by mouth daily for 4 days. 8 tablet Noheli Melder, Noberto Retort, PA-C      PDMP not reviewed this encounter.   Jeani Hawking, PA-C 08/10/23 1209    RaspetNoberto Retort, PA-C 08/10/23 1700

## 2023-08-10 NOTE — ED Triage Notes (Signed)
Pt reports she has a cough, sore throat, chills, and body aches x 1- week worsened x 1 day

## 2023-08-10 NOTE — Discharge Instructions (Signed)
Take azithromycin as prescribed.  Start prednisone 40 mg for 4 days.  Do not take NSAIDs with this medication due to risk of GI bleeding including aspirin, ibuprofen/Advil/Motrin, naproxen/Aleve.  Continue your inhalers as previously prescribed.  Use Mucinex, Flonase, Tylenol for additional symptom relief.  If anything worsens or changes and you have increasing cough, shortness of breath, fever, weakness, nausea/vomiting you need to be seen immediately.  Keep your appointment with your primary care as scheduled next week even if you are feeling better for a recheck.

## 2023-08-15 DIAGNOSIS — R933 Abnormal findings on diagnostic imaging of other parts of digestive tract: Secondary | ICD-10-CM | POA: Diagnosis not present

## 2023-08-15 DIAGNOSIS — R888 Abnormal findings in other body fluids and substances: Secondary | ICD-10-CM | POA: Diagnosis not present

## 2023-08-15 DIAGNOSIS — Z79899 Other long term (current) drug therapy: Secondary | ICD-10-CM | POA: Diagnosis not present

## 2023-11-24 DIAGNOSIS — E559 Vitamin D deficiency, unspecified: Secondary | ICD-10-CM | POA: Diagnosis not present

## 2023-11-24 DIAGNOSIS — E785 Hyperlipidemia, unspecified: Secondary | ICD-10-CM | POA: Diagnosis not present

## 2023-11-24 DIAGNOSIS — R7303 Prediabetes: Secondary | ICD-10-CM | POA: Diagnosis not present

## 2023-11-24 DIAGNOSIS — R5383 Other fatigue: Secondary | ICD-10-CM | POA: Diagnosis not present

## 2023-12-01 ENCOUNTER — Other Ambulatory Visit (HOSPITAL_COMMUNITY): Payer: Self-pay | Admitting: Family Medicine

## 2023-12-01 DIAGNOSIS — J439 Emphysema, unspecified: Secondary | ICD-10-CM | POA: Diagnosis not present

## 2023-12-01 DIAGNOSIS — E039 Hypothyroidism, unspecified: Secondary | ICD-10-CM | POA: Diagnosis not present

## 2023-12-01 DIAGNOSIS — I700 Atherosclerosis of aorta: Secondary | ICD-10-CM | POA: Diagnosis not present

## 2023-12-01 DIAGNOSIS — M79621 Pain in right upper arm: Secondary | ICD-10-CM | POA: Diagnosis not present

## 2023-12-01 DIAGNOSIS — Z122 Encounter for screening for malignant neoplasm of respiratory organs: Secondary | ICD-10-CM

## 2023-12-01 DIAGNOSIS — M810 Age-related osteoporosis without current pathological fracture: Secondary | ICD-10-CM | POA: Diagnosis not present

## 2023-12-01 DIAGNOSIS — R5383 Other fatigue: Secondary | ICD-10-CM | POA: Diagnosis not present

## 2023-12-01 DIAGNOSIS — R7303 Prediabetes: Secondary | ICD-10-CM | POA: Diagnosis not present

## 2023-12-01 DIAGNOSIS — R888 Abnormal findings in other body fluids and substances: Secondary | ICD-10-CM | POA: Diagnosis not present

## 2023-12-01 DIAGNOSIS — E785 Hyperlipidemia, unspecified: Secondary | ICD-10-CM | POA: Diagnosis not present

## 2023-12-01 DIAGNOSIS — J449 Chronic obstructive pulmonary disease, unspecified: Secondary | ICD-10-CM | POA: Diagnosis not present

## 2023-12-01 DIAGNOSIS — J440 Chronic obstructive pulmonary disease with acute lower respiratory infection: Secondary | ICD-10-CM | POA: Diagnosis not present

## 2024-01-20 ENCOUNTER — Encounter (INDEPENDENT_AMBULATORY_CARE_PROVIDER_SITE_OTHER): Payer: Self-pay | Admitting: *Deleted

## 2024-03-03 DIAGNOSIS — J441 Chronic obstructive pulmonary disease with (acute) exacerbation: Secondary | ICD-10-CM | POA: Diagnosis not present

## 2024-05-26 DIAGNOSIS — R7303 Prediabetes: Secondary | ICD-10-CM | POA: Diagnosis not present

## 2024-05-26 DIAGNOSIS — E785 Hyperlipidemia, unspecified: Secondary | ICD-10-CM | POA: Diagnosis not present

## 2024-05-26 DIAGNOSIS — M8000XA Age-related osteoporosis with current pathological fracture, unspecified site, initial encounter for fracture: Secondary | ICD-10-CM | POA: Diagnosis not present

## 2024-05-26 DIAGNOSIS — R5383 Other fatigue: Secondary | ICD-10-CM | POA: Diagnosis not present

## 2024-06-01 ENCOUNTER — Other Ambulatory Visit (HOSPITAL_COMMUNITY): Payer: Self-pay | Admitting: Family Medicine

## 2024-06-01 DIAGNOSIS — E039 Hypothyroidism, unspecified: Secondary | ICD-10-CM | POA: Diagnosis not present

## 2024-06-01 DIAGNOSIS — E785 Hyperlipidemia, unspecified: Secondary | ICD-10-CM | POA: Diagnosis not present

## 2024-06-01 DIAGNOSIS — M79621 Pain in right upper arm: Secondary | ICD-10-CM | POA: Diagnosis not present

## 2024-06-01 DIAGNOSIS — M810 Age-related osteoporosis without current pathological fracture: Secondary | ICD-10-CM | POA: Diagnosis not present

## 2024-06-01 DIAGNOSIS — I700 Atherosclerosis of aorta: Secondary | ICD-10-CM | POA: Diagnosis not present

## 2024-06-01 DIAGNOSIS — R5383 Other fatigue: Secondary | ICD-10-CM | POA: Diagnosis not present

## 2024-06-01 DIAGNOSIS — R7303 Prediabetes: Secondary | ICD-10-CM | POA: Diagnosis not present

## 2024-06-01 DIAGNOSIS — Z122 Encounter for screening for malignant neoplasm of respiratory organs: Secondary | ICD-10-CM

## 2024-06-01 DIAGNOSIS — R888 Abnormal findings in other body fluids and substances: Secondary | ICD-10-CM | POA: Diagnosis not present

## 2024-06-01 DIAGNOSIS — Z5320 Procedure and treatment not carried out because of patient's decision for unspecified reasons: Secondary | ICD-10-CM | POA: Diagnosis not present

## 2024-06-01 DIAGNOSIS — J439 Emphysema, unspecified: Secondary | ICD-10-CM | POA: Diagnosis not present

## 2024-06-01 DIAGNOSIS — J449 Chronic obstructive pulmonary disease, unspecified: Secondary | ICD-10-CM | POA: Diagnosis not present

## 2024-06-14 ENCOUNTER — Other Ambulatory Visit (HOSPITAL_COMMUNITY): Payer: Self-pay | Admitting: Family Medicine

## 2024-06-14 DIAGNOSIS — Z122 Encounter for screening for malignant neoplasm of respiratory organs: Secondary | ICD-10-CM

## 2024-07-21 ENCOUNTER — Encounter (HOSPITAL_COMMUNITY): Payer: Self-pay

## 2024-07-21 ENCOUNTER — Ambulatory Visit (HOSPITAL_COMMUNITY)
Admission: RE | Admit: 2024-07-21 | Discharge: 2024-07-21 | Disposition: A | Discharge status: Home / Self Care | Source: Ambulatory Visit | Attending: Family Medicine | Admitting: Family Medicine

## 2024-07-21 DIAGNOSIS — Z122 Encounter for screening for malignant neoplasm of respiratory organs: Secondary | ICD-10-CM

## 2024-09-07 DIAGNOSIS — R030 Elevated blood-pressure reading, without diagnosis of hypertension: Secondary | ICD-10-CM | POA: Diagnosis not present

## 2024-09-07 DIAGNOSIS — Z0000 Encounter for general adult medical examination without abnormal findings: Secondary | ICD-10-CM | POA: Diagnosis not present

## 2024-09-14 DIAGNOSIS — I10 Essential (primary) hypertension: Secondary | ICD-10-CM | POA: Diagnosis not present

## 2024-09-14 DIAGNOSIS — R030 Elevated blood-pressure reading, without diagnosis of hypertension: Secondary | ICD-10-CM | POA: Diagnosis not present

## 2024-09-17 ENCOUNTER — Ambulatory Visit (INDEPENDENT_AMBULATORY_CARE_PROVIDER_SITE_OTHER): Admitting: Obstetrics & Gynecology

## 2024-09-17 ENCOUNTER — Encounter: Payer: Self-pay | Admitting: Obstetrics & Gynecology

## 2024-09-17 ENCOUNTER — Other Ambulatory Visit (HOSPITAL_COMMUNITY)
Admission: RE | Admit: 2024-09-17 | Discharge: 2024-09-17 | Disposition: A | Discharge status: Home / Self Care | Source: Ambulatory Visit | Attending: Obstetrics & Gynecology | Admitting: Obstetrics & Gynecology

## 2024-09-17 VITALS — BP 150/82 | HR 87 | Ht 61.0 in | Wt 115.0 lb

## 2024-09-17 DIAGNOSIS — Z124 Encounter for screening for malignant neoplasm of cervix: Secondary | ICD-10-CM | POA: Diagnosis not present

## 2024-09-17 DIAGNOSIS — N952 Postmenopausal atrophic vaginitis: Secondary | ICD-10-CM | POA: Diagnosis not present

## 2024-09-17 DIAGNOSIS — Z01419 Encounter for gynecological examination (general) (routine) without abnormal findings: Secondary | ICD-10-CM

## 2024-09-17 DIAGNOSIS — Z1151 Encounter for screening for human papillomavirus (HPV): Secondary | ICD-10-CM | POA: Diagnosis not present

## 2024-09-17 DIAGNOSIS — N941 Unspecified dyspareunia: Secondary | ICD-10-CM | POA: Diagnosis not present

## 2024-09-17 MED ORDER — ESTRADIOL 10 MCG VA TABS: ORAL_TABLET | VAGINAL | 0 refills | Status: DC

## 2024-09-17 MED ORDER — ESTRADIOL 10 MCG VA TABS: ORAL_TABLET | VAGINAL | 11 refills | Status: AC

## 2024-09-17 NOTE — Progress Notes (Signed)
 Subjective:     [name] [name] is a 73 y.o. female here for a routine exam.  No LMP recorded. Patient is postmenopausal. G1P1001 Birth Control Method:  menopause Menstrual Calendar(currently): na  Current complaints: none.   Current acute medical issues:  COPD   Recent Gynecologic History No LMP recorded. Patient is postmenopausal. Last Pap: unsure,   Last mammogram: 01/2022,  normal  Past Medical History:  Diagnosis Date   Atrial septal defect    COPD (chronic obstructive pulmonary disease) (HCC)    Dyspnea    Hyperlipidemia    Hypertension    pulmonary   Kyphoscoliosis    thoracic spine    Past Surgical History:  Procedure Laterality Date   CARDIAC SURGERY     TONSILLECTOMY      OB History     Gravida  1   Para  1   Term  1   Preterm      AB      Living  1      SAB      IAB      Ectopic      Multiple      Live Births              Social History   Socioeconomic History   Marital status: Married    Spouse name: Not on file   Number of children: Not on file   Years of education: Not on file   Highest education level: Not on file  Occupational History   Not on file  Tobacco Use   Smoking status: Former   Smokeless tobacco: Never  Vaping Use   Vaping status: Never Used  Substance and Sexual Activity   Alcohol use: Yes    Comment: occasional   Drug use: No   Sexual activity: Not Currently    Birth control/protection: Post-menopausal  Other Topics Concern   Not on file  Social History Narrative   Not on file   Social Drivers of Health   Financial Resource Strain: Low Risk  (09/17/2024)   Overall Financial Resource Strain (CARDIA)    Difficulty of Paying Living Expenses: Not very hard  Food Insecurity: No Food Insecurity (09/17/2024)   Hunger Vital Sign    Worried About Running Out of Food in the Last Year: Never true    Ran Out of Food in the Last Year: Never true  Transportation Needs: No Transportation Needs (09/17/2024)    PRAPARE - Administrator, Civil Service (Medical): No    Lack of Transportation (Non-Medical): No  Physical Activity: Insufficiently Active (09/17/2024)   Exercise Vital Sign    Days of Exercise per Week: 3 days    Minutes of Exercise per Session: 30 min  Stress: Patient Declined (09/17/2024)   [employer] of Occupational Health - Occupational Stress Questionnaire    Feeling of Stress: Patient declined  Social Connections: Moderately Integrated (09/17/2024)   Social Connection and Isolation Panel    Frequency of Communication with Friends and Family: Three times a week    Frequency of Social Gatherings with Friends and Family: More than three times a week    Attends Religious Services: More than 4 times per year    Active Member of [employer] or Organizations: No    Attends Banker Meetings: Never    Marital Status: Married    Family History  Problem Relation Age of Onset   Lung cancer Paternal Grandfather    Breast cancer Paternal  Grandmother    Colon cancer Father    Stroke Mother    Hypertension Mother    Pancreatic cancer Maternal Aunt      Current Outpatient Medications:    albuterol  (PROVENTIL ) (2.5 MG/3ML) 0.083% nebulizer solution, Take 2.5 mg by nebulization every 6 (six) hours as needed for wheezing or shortness of breath. , Disp: , Rfl:    ALPRAZolam (XANAX) 0.5 MG tablet, Take 0.25-0.5 mg by mouth at bedtime as needed for anxiety. *May take up to 4 times daily as needed for anxiety, Disp: , Rfl:    Estradiol (VAGIFEM) 10 MCG TABS vaginal tablet, Place in the vagina nightly for 14 consecutive nights, Disp: 14 tablet, Rfl: 0   Estradiol (VAGIFEM) 10 MCG TABS vaginal tablet, Place at bedtime twice a week, Disp: 8 tablet, Rfl: 11   SYNTHROID  50 MCG tablet, TAKE ONE TABLET BY MOUTH DAILY., Disp: 30 tablet, Rfl: 3   VENTOLIN  HFA 108 (90 BASE) MCG/ACT inhaler, Inhale 1-2 puffs into the lungs every 6 (six) hours as needed for wheezing or shortness of  breath. , Disp: , Rfl:    azithromycin  (ZITHROMAX ) 250 MG tablet, Take 1 tablet (250 mg total) by mouth daily. Take first 2 tablets together, then 1 every day until finished. (Patient not taking: Reported on 09/17/2024), Disp: 6 tablet, Rfl: 0   Cholecalciferol (VITAMIN D) 50 MCG (2000 UT) tablet, Take 2,000 Units by mouth daily. (Patient not taking: Reported on 09/17/2024), Disp: , Rfl:    furosemide  (LASIX ) 20 MG tablet, Take 1 tablet (20 mg total) by mouth daily as needed for fluid or edema (SOB). (Patient not taking: Reported on 09/17/2024), Disp: 30 tablet, Rfl: 1  Review of Systems  Review of Systems  Constitutional: Negative for fever, chills, weight loss, malaise/fatigue and diaphoresis.  HENT: Negative for hearing loss, ear pain, nosebleeds, congestion, sore throat, neck pain, tinnitus and ear discharge.   Eyes: Negative for blurred vision, double vision, photophobia, pain, discharge and redness.  Respiratory: Negative for cough, hemoptysis, sputum production, shortness of breath, wheezing and stridor.   Cardiovascular: Negative for chest pain, palpitations, orthopnea, claudication, leg swelling and PND.  Gastrointestinal: negative for abdominal pain. Negative for heartburn, nausea, vomiting, diarrhea, constipation, blood in stool and melena.  Genitourinary: Negative for dysuria, urgency, frequency, hematuria and flank pain.  Musculoskeletal: Negative for myalgias, back pain, joint pain and falls.  Skin: Negative for itching and rash.  Neurological: Negative for dizziness, tingling, tremors, sensory change, speech change, focal weakness, seizures, loss of consciousness, weakness and headaches.  Endo/Heme/Allergies: Negative for environmental allergies and polydipsia. Does not bruise/bleed easily.  Psychiatric/Behavioral: Negative for depression, suicidal ideas, hallucinations, memory loss and substance abuse. The patient is not nervous/anxious and does not have insomnia.         Objective:  Blood pressure (!) 150/82, pulse 87, height 5' 1 (1.549 m), weight 115 lb (52.2 kg).   Physical Exam  Vitals reviewed. Constitutional: She is oriented to person, place, and time. She appears well-developed and well-nourished.  HENT:  Head: Normocephalic and atraumatic.        Right Ear: External ear normal.  Left Ear: External ear normal.  Nose: Nose normal.  Mouth/Throat: Oropharynx is clear and moist.  Eyes: Conjunctivae and EOM are normal. Pupils are equal, round, and reactive to light. Right eye exhibits no discharge. Left eye exhibits no discharge. No scleral icterus.  Neck: Normal range of motion. Neck supple. No tracheal deviation present. No thyromegaly present.  Cardiovascular: Normal rate, regular  rhythm, normal heart sounds and intact distal pulses.  Exam reveals no gallop and no friction rub.   No murmur heard. Respiratory: Effort normal and breath sounds normal. No respiratory distress. She has no wheezes. She has no rales. She exhibits no tenderness.  GI: Soft. Bowel sounds are normal. She exhibits no distension and no mass. There is no tenderness. There is no rebound and no guarding.  Genitourinary:  Breasts no masses skin changes or nipple changes bilaterally      Vulva is normal without lesions Vagina is pink moist without discharge Cervix normal in appearance and pap is done Uterus is normal size shape and contour Adnexa is negative with normal sized ovaries   Musculoskeletal: Normal range of motion. She exhibits no edema and no tenderness.  Neurological: She is alert and oriented to person, place, and time. She has normal reflexes. She displays normal reflexes. No cranial nerve deficit. She exhibits normal muscle tone. Coordination normal.  Skin: Skin is warm and dry. No rash noted. No erythema. No pallor.  Psychiatric: She has a normal mood and affect. Her behavior is normal. Judgment and thought content normal.       Medications Ordered at today's  visit: Meds ordered this encounter  Medications   Estradiol (VAGIFEM) 10 MCG TABS vaginal tablet    Sig: Place in the vagina nightly for 14 consecutive nights    Dispense:  14 tablet    Refill:  0   Estradiol (VAGIFEM) 10 MCG TABS vaginal tablet    Sig: Place at bedtime twice a week    Dispense:  8 tablet    Refill:  11    Other orders placed at today's visit: No orders of the defined types were placed in this encounter.    ASSESSMENT + PLAN:    ICD-10-CM   1. Well woman exam with routine gynecological exam  Z01.419     2. Encounter for gynecological examination with Papanicolaou smear of cervix  Z01.419 Cytology - PAP( [place])    3. Vaginal atrophy: vagifem Rx  N95.2     4. Dyspareunia, female: due to vaginal atrophy  N94.10    vagifem RX          Return if symptoms worsen or fail to improve.

## 2024-09-21 LAB — CYTOLOGY - PAP
Comment: NEGATIVE
Diagnosis: NEGATIVE
High risk HPV: NEGATIVE

## 2024-09-22 ENCOUNTER — Telehealth: Payer: Self-pay | Admitting: *Deleted

## 2024-09-22 ENCOUNTER — Ambulatory Visit (HOSPITAL_COMMUNITY): Payer: Self-pay | Admitting: Obstetrics & Gynecology

## 2024-09-22 ENCOUNTER — Other Ambulatory Visit: Payer: Self-pay | Admitting: *Deleted

## 2024-09-22 MED ORDER — ESTRADIOL 10 MCG VA TABS: ORAL_TABLET | VAGINAL | 0 refills | Status: AC

## 2024-09-22 NOTE — Telephone Encounter (Signed)
 Pt's insurance company called and requested Estradiol tablet prescription be changed to # 18 so it would be covered. New prescription with # 18 tabs sent to pharmacy and sent to Dr. Jayne. JSY

## 2024-09-28 ENCOUNTER — Ambulatory Visit: Admitting: Internal Medicine

## 2024-09-29 ENCOUNTER — Ambulatory Visit: Attending: Internal Medicine | Admitting: Internal Medicine

## 2024-09-29 ENCOUNTER — Encounter: Payer: Self-pay | Admitting: Internal Medicine

## 2024-09-29 VITALS — BP 150/90 | HR 80 | Ht 61.0 in | Wt 116.4 lb

## 2024-09-29 DIAGNOSIS — I4510 Unspecified right bundle-branch block: Secondary | ICD-10-CM | POA: Diagnosis not present

## 2024-09-29 DIAGNOSIS — R030 Elevated blood-pressure reading, without diagnosis of hypertension: Secondary | ICD-10-CM | POA: Insufficient documentation

## 2024-09-29 DIAGNOSIS — Z8774 Personal history of (corrected) congenital malformations of heart and circulatory system: Secondary | ICD-10-CM | POA: Diagnosis not present

## 2024-09-29 NOTE — Progress Notes (Signed)
 Cardiology Office Note  Date: 09/29/2024   ID: ABBYGALE [name], DOB August 17, 1951, MRN [id number]  PCP:  [name] Norleen PEDLAR, MD  Cardiologist:  Diannah SHAUNNA Maywood, MD Electrophysiologist:  None   Reason for Office Visit: Follow-up of ASD repair   History of Present Illness: [name] [name] is a 73 y.o. female known to have ASD repair in 2011, HLD is here for follow-up visit.  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 Normal echocardiograms in 2015, 2019 and 2024. Normal GXT for chest pain in 2015.  Refuses statins as her mother had myalgias with statins.  She is here today for follow-up.  I reviewed and discussed echocardiogram findings with the patient today.  She checks blood pressure sometimes at home.  Sometimes it is 150 and on other occasions it is normal.  Does not have any angina or DOE.  No dizziness, syncope, palpitations, leg swelling.  Doing great overall.  Past Medical History:  Diagnosis Date   Atrial septal defect    COPD (chronic obstructive pulmonary disease) (HCC)    Dyspnea    Hyperlipidemia    Hypertension    pulmonary   Kyphoscoliosis    thoracic spine    Past Surgical History:  Procedure Laterality Date   CARDIAC SURGERY     TONSILLECTOMY      Current Outpatient Medications  Medication Sig Dispense Refill   albuterol  (PROVENTIL ) (2.5 MG/3ML) 0.083% nebulizer solution Take 2.5 mg by nebulization every 6 (six) hours as needed for wheezing or shortness of breath.      ALPRAZolam (XANAX) 0.5 MG tablet Take 0.25-0.5 mg by mouth at bedtime as needed for anxiety. *May take up to 4 times daily as needed for anxiety     Cholecalciferol (VITAMIN D) 50 MCG (2000 UT) tablet Take 2,000 Units by mouth daily.     ipratropium-albuterol  (DUONEB) 0.5-2.5 (3) MG/3ML SOLN Inhale 3 mLs into the lungs.     SYNTHROID  50 MCG tablet TAKE ONE TABLET BY MOUTH DAILY. 30 tablet 3   VENTOLIN  HFA 108 (90 BASE) MCG/ACT  inhaler Inhale 1-2 puffs into the lungs every 6 (six) hours as needed for wheezing or shortness of breath.      azithromycin  (ZITHROMAX ) 250 MG tablet Take 1 tablet (250 mg total) by mouth daily. Take first 2 tablets together, then 1 every day until finished. (Patient not taking: Reported on 09/29/2024) 6 tablet 0   Estradiol (VAGIFEM) 10 MCG TABS vaginal tablet Place at bedtime twice a week (Patient not taking: Reported on 09/29/2024) 8 tablet 11   Estradiol (VAGIFEM) 10 MCG TABS vaginal tablet Place in the vagina nightly for 14 consecutive nights (Patient not taking: Reported on 09/29/2024) 18 tablet 0   furosemide  (LASIX ) 20 MG tablet Take 1 tablet (20 mg total) by mouth daily as needed for fluid or edema (SOB). (Patient not taking: Reported on 09/29/2024) 30 tablet 1   No current facility-administered medications for this visit.   Allergies:  Levofloxacin, Sulfonamide derivatives, Moxifloxacin, and Moxifloxacin hcl in nacl   Social History: The patient  reports that she has quit smoking. She has never used smokeless tobacco. She reports current alcohol use. She reports that she does not use drugs.   Family History: The patient's family history includes Breast cancer in her paternal grandmother; Colon cancer in her father; Hypertension in her mother; Lung cancer in her paternal grandfather; Pancreatic cancer in her maternal aunt; Stroke in  her mother.   ROS:  Please see the history of present illness. Otherwise, complete review of systems is positive for none.  All other systems are reviewed and negative.   Physical Exam: VS:  BP (!) 150/90 (BP Location: Left Arm, Cuff Size: Normal)   Pulse 80   Ht 5' 1 (1.549 m)   Wt 116 lb 6.4 oz (52.8 kg)   SpO2 100%   BMI 21.99 kg/m , BMI Body mass index is 21.99 kg/m.  Wt Readings from Last 3 Encounters:  09/29/24 116 lb 6.4 oz (52.8 kg)  09/17/24 115 lb (52.2 kg)  05/16/23 118 lb 12.8 oz (53.9 kg)    General: Patient appears comfortable at  rest. HEENT: Conjunctiva and lids normal, oropharynx clear with moist mucosa. Neck: Supple, no elevated JVP or carotid bruits, no thyromegaly. Lungs: Clear to auscultation, nonlabored breathing at rest. Cardiac: Regular rate and rhythm, no S3 or significant systolic murmur, no pericardial rub. Abdomen: Soft, nontender, no hepatomegaly, bowel sounds present, no guarding or rebound. Extremities: No pitting edema, distal pulses 2+. Skin: Warm and dry. Musculoskeletal: No kyphosis. Neuropsychiatric: Alert and oriented x3, affect grossly appropriate.  Recent Labwork: No results found for requested labs within last 365 days.     Component Value Date/Time   CHOL  11/09/2009 0455    114        ATP III CLASSIFICATION:  <200     mg/dL   Desirable  799-760  mg/dL   Borderline High  >=759    mg/dL   High          TRIG 54 11/09/2009 0455   HDL 38 (L) 11/09/2009 0455   CHOLHDL 3.0 11/09/2009 0455   VLDL 11 11/09/2009 0455   LDLCALC  11/09/2009 0455    65        Total Cholesterol/HDL:CHD Risk Coronary Heart Disease Risk Table                     Men   Women  1/2 Average Risk   3.4   3.3  Average Risk       5.0   4.4  2 X Average Risk   9.6   7.1  3 X Average Risk  23.4   11.0        Use the calculated Patient Ratio above and the CHD Risk Table to determine the patient's CHD Risk.        ATP III CLASSIFICATION (LDL):  <100     mg/dL   Optimal  899-870  mg/dL   Near or Above                    Optimal  130-159  mg/dL   Borderline  839-810  mg/dL   High  >809     mg/dL   Very High    Assessment and Plan:  # ASD repair in 2011 -Echocardiograms in 2015, 2019 and 2024 showed no evidence of interatrial shunt.  No need to repeat anymore echocardiograms.  # HLD, not at goal - Refused statins (her mother had myalgias).  LDL 150 in June 2025, elevated.  TG 46, within normal limits. Can try PCSK9 inhibitors or Leqvio but she wants to do research, think about it and get back to me.   #  Elevated blood-pressure reading without diagnosis of HTN - She checks blood pressures at home sometimes, it ranges around 130-150.  I strongly encouraged her to keep a log, check blood  pressures twice a day in a.m. and p.m. and take the log to her PCP's office.  I have spent a total of 30 minutes with patient reviewing chart, EKGs, labs and examining patient as well as establishing an assessment and plan that was discussed with the patient.  > 50% of time was spent in direct patient care.    Medication Adjustments/Labs and Tests Ordered: Current medicines are reviewed at length with the patient today.  Concerns regarding medicines are outlined above.   Tests Ordered: Orders Placed This Encounter  Procedures   EKG 12-Lead    Medication Changes: No orders of the defined types were placed in this encounter.   Disposition:  Follow up PRN  Signed, Saber Dickerman Arleta Maywood, MD, 09/29/2024 8:50 AM    [facility] [address] [place], [place] [address]

## 2024-09-29 NOTE — Patient Instructions (Signed)
 Medication Instructions:  Your physician recommends that you continue on your current medications as directed. Please refer to the Current Medication list given to you today.   Labwork: None today  Testing/Procedures: None today  Follow-Up: As needed  Any Other Special Instructions Will Be Listed Below (If Applicable).  If you need a refill on your cardiac medications before your next appointment, please call your pharmacy.

## 2024-11-23 ENCOUNTER — Emergency Department (HOSPITAL_COMMUNITY)

## 2024-11-23 ENCOUNTER — Emergency Department (HOSPITAL_COMMUNITY)
Admission: EM | Admit: 2024-11-23 | Discharge: 2024-11-23 | Disposition: A | Discharge status: Home / Self Care | Attending: Emergency Medicine | Admitting: Emergency Medicine

## 2024-11-23 ENCOUNTER — Encounter (HOSPITAL_COMMUNITY): Payer: Self-pay

## 2024-11-23 ENCOUNTER — Other Ambulatory Visit: Payer: Self-pay

## 2024-11-23 DIAGNOSIS — J449 Chronic obstructive pulmonary disease, unspecified: Secondary | ICD-10-CM | POA: Insufficient documentation

## 2024-11-23 DIAGNOSIS — Z7951 Long term (current) use of inhaled steroids: Secondary | ICD-10-CM | POA: Insufficient documentation

## 2024-11-23 DIAGNOSIS — I10 Essential (primary) hypertension: Secondary | ICD-10-CM | POA: Insufficient documentation

## 2024-11-23 DIAGNOSIS — S0083XA Contusion of other part of head, initial encounter: Secondary | ICD-10-CM | POA: Insufficient documentation

## 2024-11-23 DIAGNOSIS — Z79899 Other long term (current) drug therapy: Secondary | ICD-10-CM | POA: Diagnosis not present

## 2024-11-23 DIAGNOSIS — W01198A Fall on same level from slipping, tripping and stumbling with subsequent striking against other object, initial encounter: Secondary | ICD-10-CM | POA: Insufficient documentation

## 2024-11-23 DIAGNOSIS — W19XXXA Unspecified fall, initial encounter: Secondary | ICD-10-CM

## 2024-11-23 NOTE — Discharge Instructions (Signed)
 You were evaluated in the emergency room following a fall. There was no acute abnormality on your ct imaging. Please return to the emergency room if you experience any new or worsening symptoms.

## 2024-11-23 NOTE — ED Provider Notes (Signed)
 " [place] [facility] AT [facility] Provider Note   CSN: [id number] Arrival date & time: 11/23/24  1510     Patient presents with: [name] [name] is a 73 y.o. female presents following mechanical ground-level fall.  Patient states that she was getting groceries out of the car and she fell back and hit her head on the car.  Did not lose consciousness.  Has not had any vomiting, headache, vision changes, extremity weakness or numbness.  Has been ambulating without difficulty.  States that she has a bump on the side of her head.  Is not on blood thinners.    Fall      Past Medical History:  Diagnosis Date   Atrial septal defect    COPD (chronic obstructive pulmonary disease) (HCC)    Dyspnea    Hyperlipidemia    Hypertension    pulmonary   Kyphoscoliosis    thoracic spine   Past Surgical History:  Procedure Laterality Date   CARDIAC SURGERY     TONSILLECTOMY       Prior to Admission medications  Medication Sig Start Date End Date Taking? Authorizing Provider  albuterol  (PROVENTIL ) (2.5 MG/3ML) 0.083% nebulizer solution Take 2.5 mg by nebulization every 6 (six) hours as needed for wheezing or shortness of breath.  04/30/19   [provider]  ALPRAZolam (XANAX) 0.5 MG tablet Take 0.25-0.5 mg by mouth at bedtime as needed for anxiety. *May take up to 4 times daily as needed for anxiety    [provider]  azithromycin  (ZITHROMAX ) 250 MG tablet Take 1 tablet (250 mg total) by mouth daily. Take first 2 tablets together, then 1 every day until finished. Patient not taking: Reported on 09/29/2024 08/10/23   Raspet, Erin K, PA-C  Cholecalciferol (VITAMIN D) 50 MCG (2000 UT) tablet Take 2,000 Units by mouth daily.    [provider]  Estradiol  (VAGIFEM ) 10 MCG TABS vaginal tablet Place at bedtime twice a week Patient not taking: Reported on 09/29/2024 09/17/24   Jayne Vonn DEL, MD  Estradiol  (VAGIFEM ) 10 MCG TABS vaginal tablet  Place in the vagina nightly for 14 consecutive nights Patient not taking: Reported on 09/29/2024 09/22/24   Jayne Vonn DEL, MD  furosemide  (LASIX ) 20 MG tablet Take 1 tablet (20 mg total) by mouth daily as needed for fluid or edema (SOB). Patient not taking: Reported on 09/29/2024 07/04/23   Mallipeddi, Vishnu P, MD  ipratropium-albuterol  (DUONEB) 0.5-2.5 (3) MG/3ML SOLN Inhale 3 mLs into the lungs. 03/03/24   [provider]  SYNTHROID  50 MCG tablet TAKE ONE TABLET BY MOUTH DAILY. 09/08/16   Jayne Vonn DEL, MD  VENTOLIN  HFA 108 (90 BASE) MCG/ACT inhaler Inhale 1-2 puffs into the lungs every 6 (six) hours as needed for wheezing or shortness of breath.  11/16/13   [provider]    Allergies: Levofloxacin, Sulfonamide derivatives, Moxifloxacin, and Moxifloxacin hcl in nacl    Review of Systems  Musculoskeletal:  Positive for myalgias.    Updated Vital Signs BP 124/79 (BP Location: Right Arm)   Pulse 89   Temp 98 F (36.7 C) (Oral)   Resp 18   Ht 5' 1 (1.549 m)   Wt 52.8 kg   SpO2 96%   BMI 21.99 kg/m   Physical Exam Vitals and nursing note reviewed.  Constitutional:      General: She is not in acute distress.    Appearance: She is well-developed.  HENT:  Head:     Comments: Small hematoma on posterior right temple without any gross deformities, otherwise normocephalic atraumatic Eyes:     Conjunctiva/sclera: Conjunctivae normal.  Cardiovascular:     Rate and Rhythm: Normal rate and regular rhythm.     Heart sounds: No murmur heard. Pulmonary:     Effort: Pulmonary effort is normal. No respiratory distress.     Breath sounds: Normal breath sounds.  Abdominal:     Palpations: Abdomen is soft.     Tenderness: There is no abdominal tenderness.  Musculoskeletal:        General: No swelling.     Cervical back: Neck supple.  Skin:    General: Skin is warm and dry.     Capillary Refill: Capillary refill takes less than 2 seconds.  Neurological:     Mental  Status: She is alert.     Comments: Patient is alert and oriented. There is no abnormal phonation. Symmetric smile without facial droop.  Moves all extremities spontaneously. 5/5 strength in upper and lower extremities. . No sensation deficit. There is no nystagmus. EOMI, PERRL. Coordination intact with finger to nose    Psychiatric:        Mood and Affect: Mood normal.     (all labs ordered are listed, but only abnormal results are displayed) Labs Reviewed - No data to display  EKG: None  Radiology: CT Cervical Spine Wo Contrast Result Date: 11/23/2024 CLINICAL DATA:  Status post fall. EXAM: CT CERVICAL SPINE WITHOUT CONTRAST TECHNIQUE: Multidetector CT imaging of the cervical spine was performed without intravenous contrast. Multiplanar CT image reconstructions were also generated. RADIATION DOSE REDUCTION: This exam was performed according to the departmental dose-optimization program which includes automated exposure control, adjustment of the mA and/or kV according to patient size and/or use of iterative reconstruction technique. COMPARISON:  None Available. FINDINGS: Alignment: Normal. Skull base and vertebrae: No acute fracture. No primary bone lesion or focal pathologic process. Soft tissues and spinal canal: No prevertebral fluid or swelling. No visible canal hematoma. Disc levels: Marked severity endplate sclerosis, mild to moderate severity anterior osteophyte formation and marked severity posterior bony spurring are seen at the levels of C3-C4, C4-C5, C5-C6 and C6-C7. There is marked severity intervertebral disc space narrowing at the levels of C3-C4, C4-C5, C5-C6 and C6-C7. Bilateral marked severity multilevel facet joint hypertrophy is noted. Upper chest: There is mild to moderate severity biapical scarring and/or atelectasis. Other: There is moderate severity left maxillary sinus mucosal thickening. IMPRESSION: Marked severity multilevel degenerative changes, as described above,  without evidence of an acute fracture or subluxation. Electronically Signed   By: Suzen Dials M.D.   On: 11/23/2024 18:36   CT Head Wo Contrast Result Date: 11/23/2024 CLINICAL DATA:  Status post fall. EXAM: CT HEAD WITHOUT CONTRAST TECHNIQUE: Contiguous axial images were obtained from the base of the skull through the vertex without intravenous contrast. RADIATION DOSE REDUCTION: This exam was performed according to the departmental dose-optimization program which includes automated exposure control, adjustment of the mA and/or kV according to patient size and/or use of iterative reconstruction technique. COMPARISON:  None Available. FINDINGS: Brain: There is mild generalized cerebral atrophy with very mild chronic white matter small vessel ischemic changes. No evidence of acute infarction, hemorrhage, hydrocephalus, extra-axial collection or mass lesion/mass effect. Vascular: No hyperdense vessel or unexpected calcification. Skull: Normal. Negative for fracture or focal lesion. Sinuses/Orbits: A small left maxillary sinus air-fluid level is seen with mild to moderate severity left maxillary sinus and  mild bilateral ethmoid sinus mucosal thickening. Other: There is mild right posterior parietal scalp soft tissue swelling. IMPRESSION: 1. No acute intracranial abnormality. 2. Mild right posterior parietal scalp soft tissue swelling. 3. Mild to moderate severity left maxillary sinus and mild bilateral ethmoid sinus disease. Electronically Signed   By: Suzen Dials M.D.   On: 11/23/2024 18:34     Procedures   Medications Ordered in the ED - No data to display  Clinical Course as of 11/23/24 1917  Tue Nov 23, 2024  1620 Patient evaluated following ground-level mechanical fall with associated head injury.  Upon arrival patient is hemodynamically stable and nontoxic-appearing.  On exam patient does have a small hematoma on her right temple.  She is without any concerning neurological symptoms or  deficits on exam.  He has no midline cervical tenderness.   Will obtain CT imaging. Does have some mild lateral R hip tenderness. Patient declines interest in xrays. [JT]  1844 CT Head Wo Contrast No acute intracranial abnormality [JT]  1847 CT Cervical Spine Wo Contrast Significant multilevel degenerative changes without acute fracture. [JT]  1847 Workup overall reassuring. She is ambulatory. Patient will be discharged home. Strict return precautions provided. [JT]    Clinical Course User Index [JT] Donnajean Lynwood DEL, PA-C                                 Medical Decision Making Amount and/or Complexity of Data Reviewed Radiology: ordered. Decision-making details documented in ED Course.   This patient presents to the ED with chief complaint(s) of fall .  The complaint involves an extensive differential diagnosis and also carries with it a high risk of complications and morbidity.   Pertinent past medical history as listed in HPI  The differential diagnosis includes  Fracture, intracranial hemorrhage, concussion Additional history obtained: Records reviewed Care Everywhere/External Records  Disposition:   Patient will be discharged home. The patient has been appropriately medically screened and/or stabilized in the ED. I have low suspicion for any other emergent medical condition which would require further screening, evaluation or treatment in the ED or require inpatient management. At time of discharge the patient is hemodynamically stable and in no acute distress. I have discussed work-up results and diagnosis with patient and answered all questions. Patient is agreeable with discharge plan. We discussed strict return precautions for returning to the emergency department and they verbalized understanding.     Social Determinants of Health:   none  This note was dictated with voice recognition software.  Despite best efforts at proofreading, errors may have occurred which can change  the documentation meaning.       Final diagnoses:  Fall, initial encounter    ED Discharge Orders     None          Donnajean Lynwood DEL, PA-C 11/23/24 1917    Elnor Jayson LABOR, DO 11/29/24 1548  "

## 2024-11-23 NOTE — ED Triage Notes (Signed)
 Pt arrived via POV c/o posterior head injury where the Pt reports she tripped over the curb trying to get into her car and hit the back f her head. Pt denies blood thinners, denies LOC and is ambulatory in Triage. Pt reports the fall occurred apprx PTA.

## 2024-12-09 ENCOUNTER — Other Ambulatory Visit (HOSPITAL_COMMUNITY): Payer: Self-pay

## 2024-12-09 DIAGNOSIS — J439 Emphysema, unspecified: Secondary | ICD-10-CM

## 2024-12-14 ENCOUNTER — Ambulatory Visit (HOSPITAL_COMMUNITY)
Admission: RE | Admit: 2024-12-14 | Discharge: 2024-12-14 | Disposition: A | Discharge status: Home / Self Care | Source: Ambulatory Visit

## 2024-12-14 DIAGNOSIS — J439 Emphysema, unspecified: Secondary | ICD-10-CM | POA: Insufficient documentation
